# Patient Record
Sex: Female | Born: 1970 | Race: White | Hispanic: No | Marital: Married | Smoking: Current every day smoker
Health system: Southern US, Academic
[De-identification: ages and names within clinical notes are randomized; demographics above are authoritative.]

## PROBLEM LIST (undated history)

## (undated) DIAGNOSIS — G4730 Sleep apnea, unspecified: Secondary | ICD-10-CM

## (undated) DIAGNOSIS — I10 Essential (primary) hypertension: Secondary | ICD-10-CM

## (undated) DIAGNOSIS — E119 Type 2 diabetes mellitus without complications: Secondary | ICD-10-CM

## (undated) DIAGNOSIS — E782 Mixed hyperlipidemia: Secondary | ICD-10-CM

## (undated) HISTORY — DX: Type 2 diabetes mellitus without complications (CMS HCC): E11.9

## (undated) HISTORY — DX: Sleep apnea, unspecified: G47.30

## (undated) HISTORY — PX: HX ENDOMETRIAL BIOPSY: 2100001106

## (undated) HISTORY — DX: Mixed hyperlipidemia: E78.2

## (undated) HISTORY — PX: HX GALL BLADDER SURGERY/CHOLE: SHX55

## (undated) HISTORY — DX: Essential (primary) hypertension: I10

## (undated) HISTORY — PX: COLONOSCOPY: SHX174

## (undated) NOTE — Progress Notes (Signed)
Formatting of this note is different from the original.  Images from the original note were not included.    [facility] Department of Surgery- Breast Surgery  Breast Surgery Progress Note     Patient Name: Meredith Brown  Med Rec: [id number]  Date of Clinic Visit: 11/08/2022   Date of Birth: June 02, 1971     Subjective:   Meredith Brown is a nice 63 y.o. female presents to the clinic 3 weeks for post-operative follow-up s/p right excisional biopsy. Patient presents without significant complaints or symptoms suggestive of complication. Her pain is well managed.     The patient was seen via telehealth.     Review of Systems:  Patient denies fevers, chills, redness or drainage at surgery site. Symptoms were reviewed and updated as appropriate.   Objective:     Physical Exam:  Axilla incision seen by photo and healing well without complication.    Pathology: 10/18/2022  A.  SOFT TISSUE, RIGHT AXILLA "LIPOMA", EXCISION:  LIPOMA.    Assessment and Plan:   Telemed Discussion:    Patient seen via video visit to the home in lieu of clinic appointment per patient request.     The patient's imaging and pathology results were reviewed with specific attention to results since last appointment and impact on plan for treatment.  The patient appropriately asked several questions regarding the pathology report and treatment care post op.     Axilla incision seen by photo and healing well without complication.    Meredith Brown is s/p right excisional biopsy.  She is doing well postoperatively. Interval three weeks post op. Reviewed details from surgery and pathology report in detail. Pathology revealed benign finding so patient can follow up as needed. All patients questions have been answers.     Plan:  Follow up PRN, always happy to see her back if needed  Patient knows to call with any questions or concerns and I am happy to see her back sooner if needed    Larita Fife T. Dengel, MD, MSc  Assistant Professor  Department of Surgery  Melanoma & Breast  Surgery      Electronically signed by Shelby Dubin, MD at 11/10/2022  7:32 PM EDT

## (undated) NOTE — Progress Notes (Signed)
Formatting of this note is different from the original.  Images from the original note were not included.      [facility] Department of Surgery  Multidisciplinary Melanoma Initial Visit Consultation     Patient Name: Meredith Brown  Med Rec: [id number]  Date of Clinic Visit: 06/27/2022   Date of Birth: 05/21/1971     PCP:  Sherilyn Cooter    Referring Physician:  Bunnie Philips     CC: Newly diagnosed with lipoma located on  right axillary tail/axilla       History of Present Illness:     Meredith Brown is a nice 69 y.o. 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 Bunnie Philips for evaluation and treatment planning for a newly diagnosed lipoma. The patient is here today to discuss her new diagnosis and findings to date.    Patient reports she is had a mass in her lower arm pit for several years it did increase in size over time although has been stable recently.  It is intermittently tender and occasionally gets infected.  She reports she saw a surgeon previously who told her there would be a low risk of lymphedema with the excision and therefore she declined.  She comes in today strongly desiring excision of this mass.  The patient reports she had a mammogram in January of 2023 at [facility] in [facility] which was normal.    Patient denies fevers, chills, weight loss, and bone pain.     Pathology: None  Imaging: None      Social History     Socioeconomic History    Marital status: Married     Spouse name: Not on file    Number of children: Not on file    Years of education: Not on file    Highest education level: Not on file   Occupational History    Not on file   Tobacco Use    Smoking status: Not on file    Smokeless tobacco: Not on file   Substance and Sexual Activity    Alcohol use: Not on file    Drug use: Not on file    Sexual activity: Not on file   Other Topics Concern    Not on file   Social History Narrative    Not on file     Social Determinants of  Health     Financial Resource Strain: Not on file   Food Insecurity: Not on file   Transportation Needs: Not on file   Physical Activity: Not on file   Stress: Not on file   Social Connections: Not on file   Intimate Partner Violence: Not on file   Housing Stability: Not on file     I have reviewed the past social, surgical and family history and updated as appropriate.    Review of Systems:     ROS   Pertinent ROS and all other systems are negative,except as discussed here and in HPI.      Physical Exam:     There were no vitals taken for this visit.    Physical Exam       Pertinent physical exam findings:   General: Alert and interactive, NAD  CV:  RRR  PULM:  CTAB, no increased WOB  Bilateral breast without masses, dominant nodule, skin changes or nipple discharge or retraction.   Lymph nodes: No axillary or supraclavicular adenopathy bilaterally.  In the lower right axilla or upper aspect of the right axillary tail there is a 5 cm soft mobile mass.  I do not appreciate any overlying skin changes.  This does not appear to be adherent to underlying structures.        Assessment/Plan:     Meredith Brown is a 39 y.o. female who comes for a 2nd opinion (patient comes from [place]) regarding her right lower axillary/upper axillary tail mass which she would like to have excised.  Given the size of this mass this is recommended.  I discussed with her that it is possibly accessory breast tissue versus a lipoma.  I believe it is very unlikely to be a malignancy and she understands a bit were she would need additional treatment possibly additional surgery.  She would previously been told there would be a risk of lymphedema but this appears to be in the subcutaneous tissues and not involving the deep axillary space so I do not expect she would have lymphedema following surgery.  The patient was very enthusiastic to move forward with scheduling in the new year and we have scheduled this for February of 2024.     The plan  for surgery was discussed including expectations for the day of surgery, risks and benefits of the procedure and possible complications including but not limited to bleeding, infection, the need for a second operation based on pathology results.  Additionally, I explained the expected recovery course and timeline, expected symptoms and reasons to call including increased swelling, fever or redness or drainage at the incision. The patient and family appropriately had several questions regarding the surgical plan and overall treatment which I answered for them.      PLAN:  Excisional biopsy right axillary tail, axillary mass under sedation with local anesthetic at [facility] on [facility] Feb 22    My total time on this date and for this encounter was 45 minutes which included the following activities preparing to see the patient, obtaining and/or reviewing separately obtained history, performing a medically necessary exam and/or evaluation, counseling and educating the patient/family/caregiver, ordering medications, tests or procedures, referring and communicating with others, and documenting clinical information in the medical record. This time is independent, non-overlapping and does not include time for any services which are separately reported.     Lynne Logan. Dengel, MD, MSc  Assistant Professor  Department of Surgery  Melanoma & Breast Surgery    Electronically signed by Erven Colla, MD at 07/05/2022  1:50 PM EST

---

## 2004-01-10 ENCOUNTER — Other Ambulatory Visit (HOSPITAL_COMMUNITY): Payer: Self-pay | Admitting: OBSTETRICS/GYNECOLOGY

## 2010-09-04 ENCOUNTER — Ambulatory Visit (HOSPITAL_COMMUNITY): Payer: Self-pay | Admitting: INTERNAL MEDICINE

## 2012-09-26 IMAGING — MG MAMMO SCREEN W CAD
1 series · 5 of 5 positions shown · non-contrast
Comparison: 

Jumper, Lorenz

Exam:
Bilateral digital screening mammogram with CAD
INDICATION: Annual.

[Series 2: R CC · right · 5 of 5 slices shown]
[im 1/5]
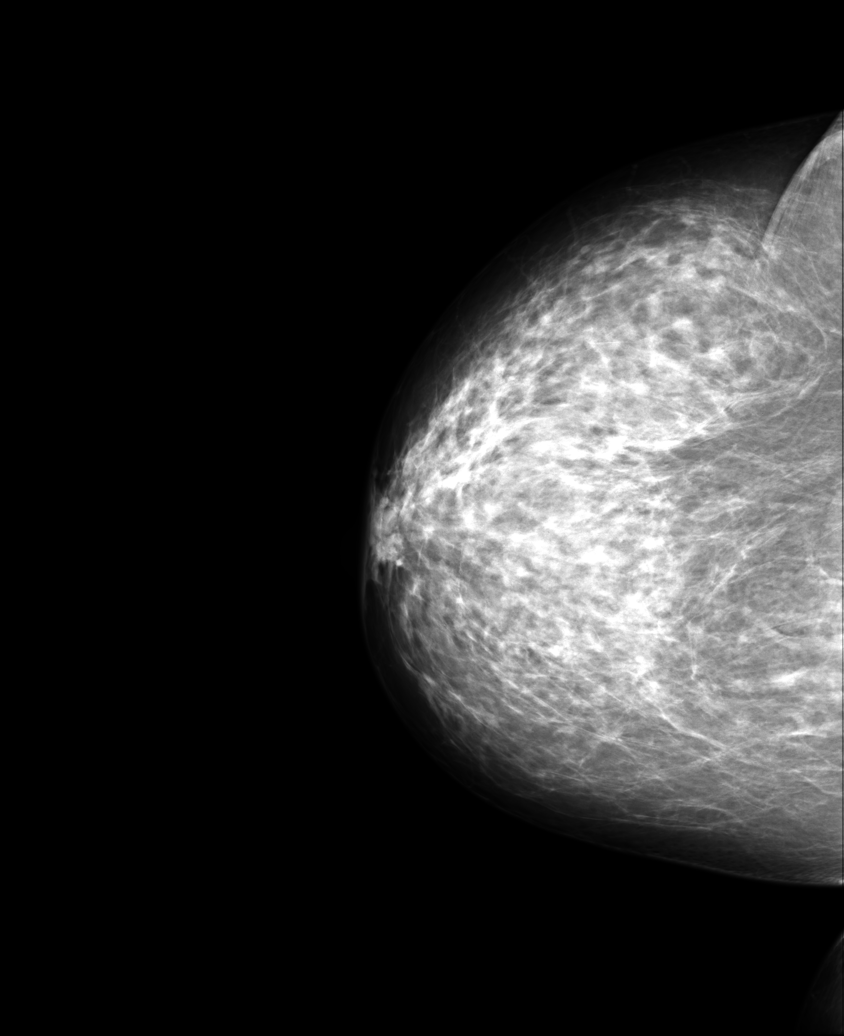
[im 2/5]
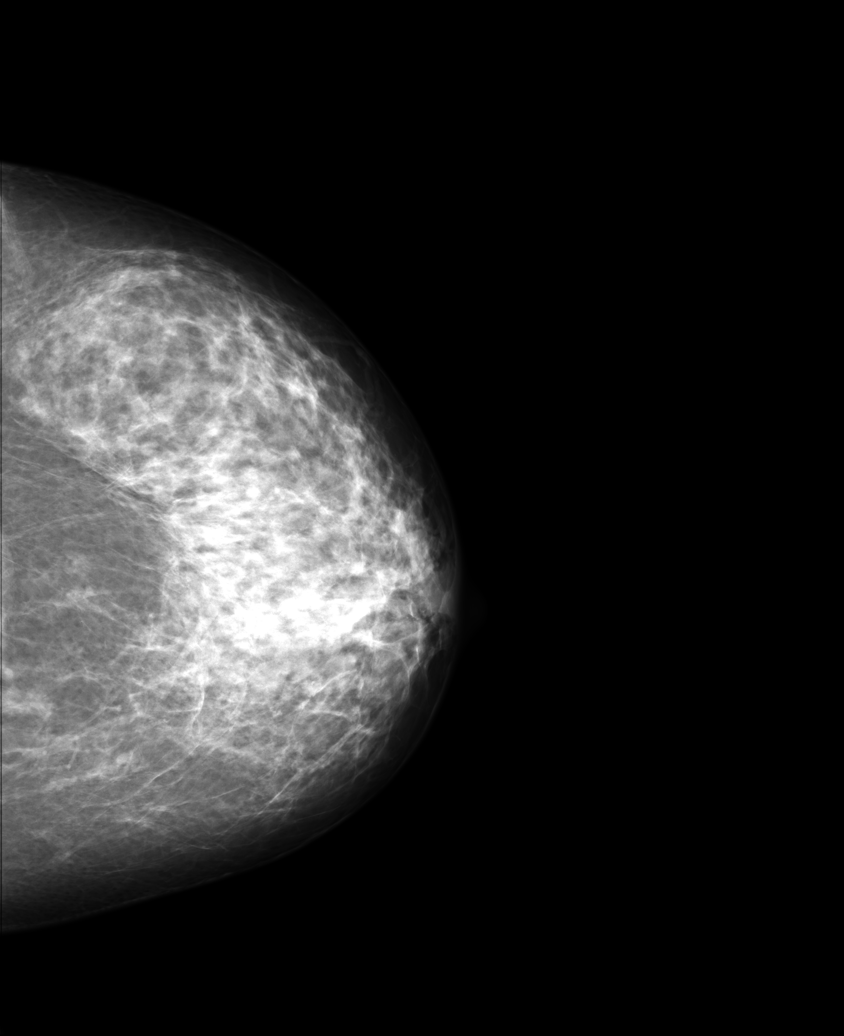
[im 3/5]
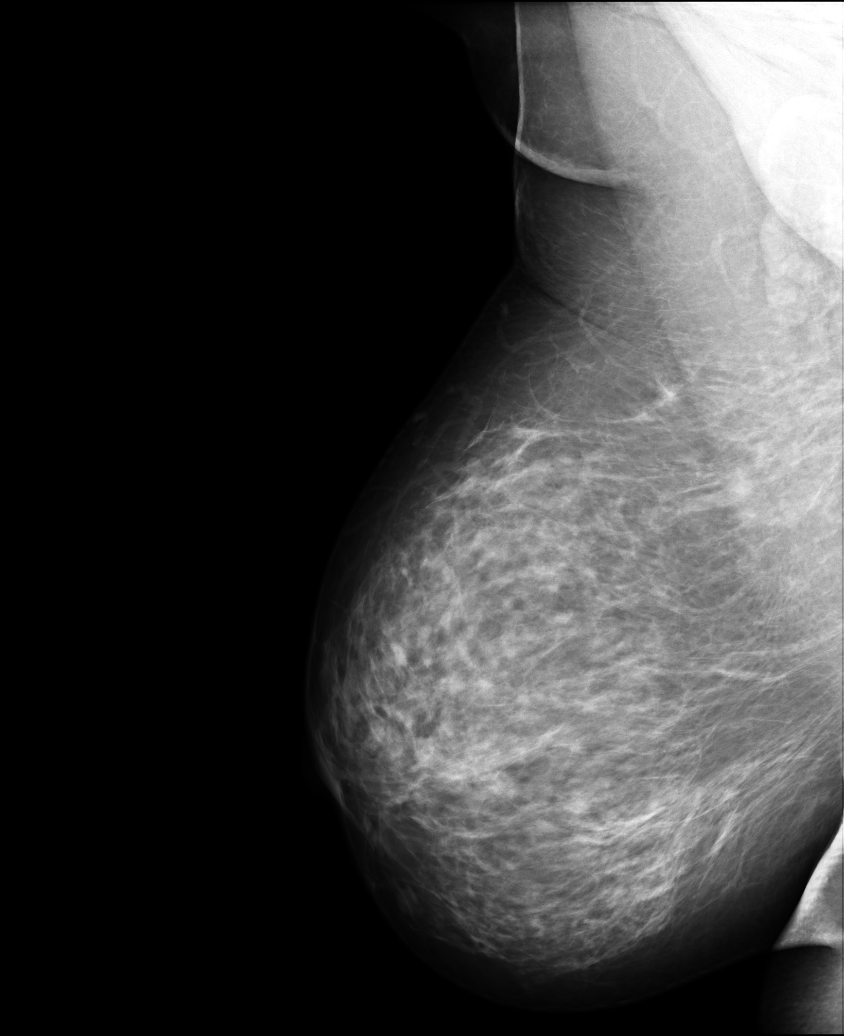
[im 4/5]
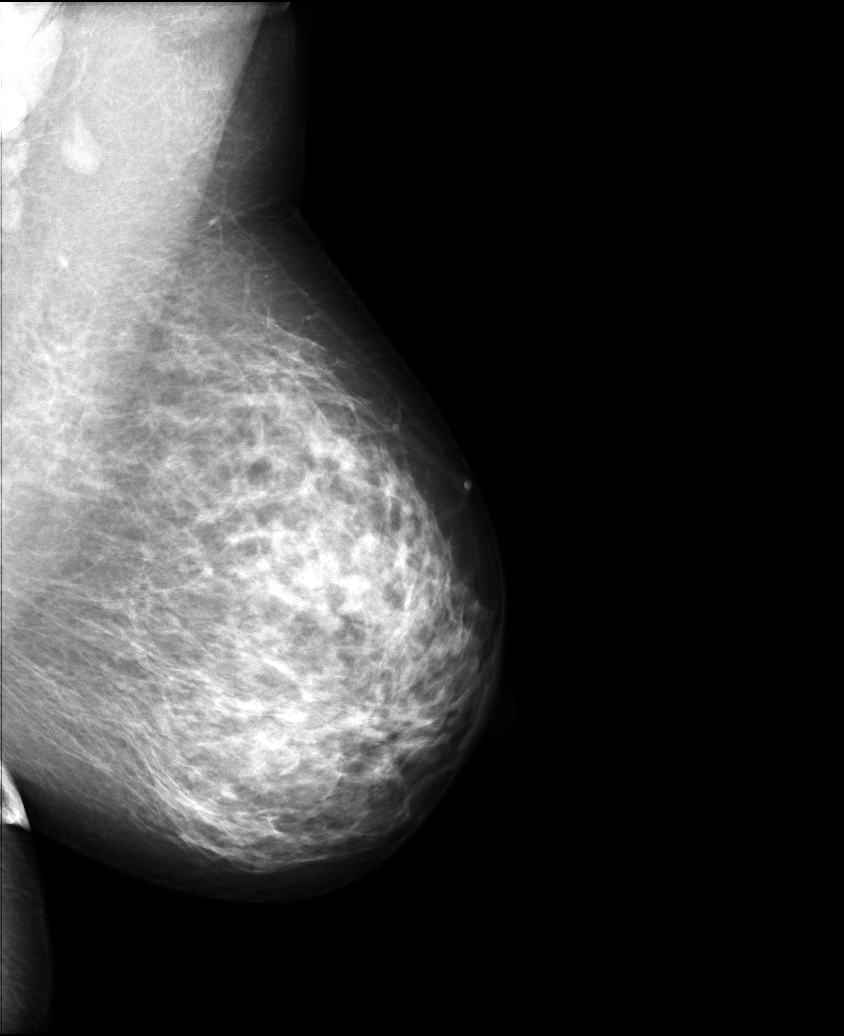
[im 5/5]
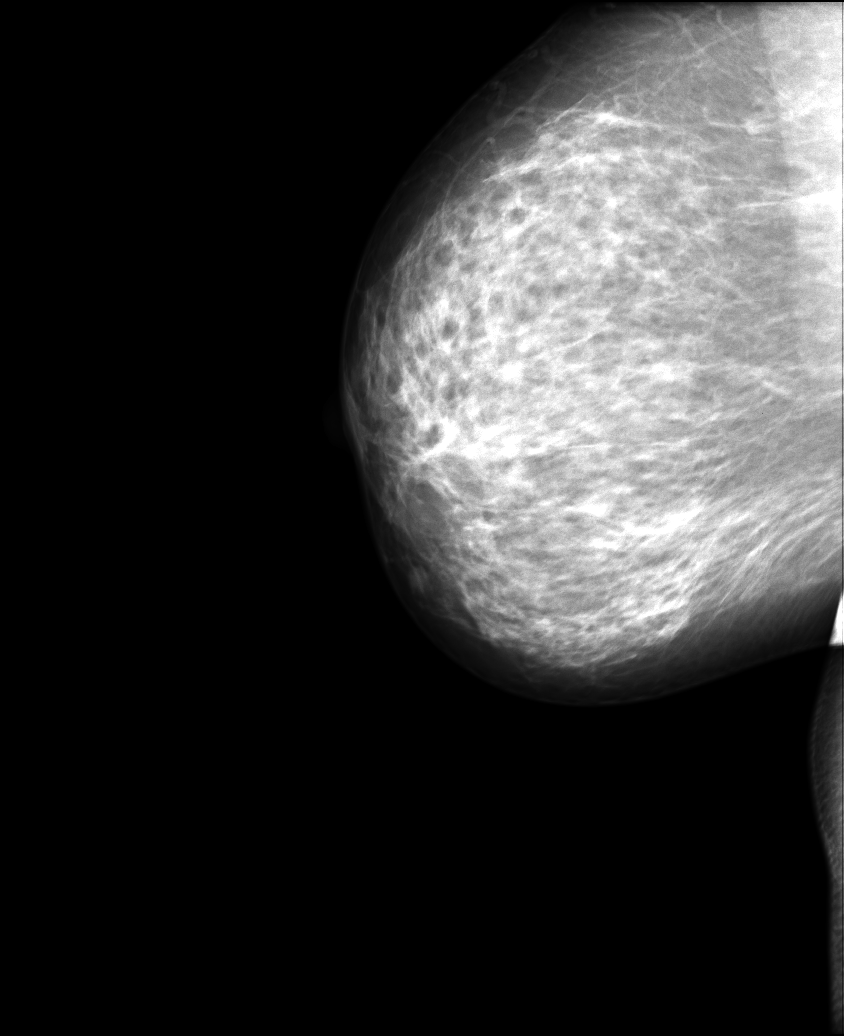

[5 of 5 positions shown; findings below may reference images not displayed]

FINDINGS: Breast parenchyma is heterogeneously dense, this decreases the sensitivity of mammogram. There is no mass or suspicious cluster of microcalcifications. There is no architectural distortion, skin thickening or nipple retraction.
IMPRESSION: Bi-Rads 2-Benign findings. 
RECOMMENDATIONS: Annual screening mammogram as per ACR guidelines is recommended. 
Final Assessment Code:
Bi-Rads 2 

BI-RADS 0
Need additional imaging evaluation
BI-RADS 1
Negative mammogram
BI-RADS 2
Benign finding
BI-RADS 3
Probably benign finding: short-interval follow-up suggested
BI-RADS 4
Suspicious abnormality:  biopsy should be considered
BI-RADS 5
Highly suggestive of malignancy; appropriate action should be taken

NOTE:
In compliance with Federal regulations, the results of this mammogram are being sent to the patient.

________________________________

## 2013-10-02 IMAGING — MG MAMMO DIGITAL SCREENING
1 series · 8 of 8 positions shown · non-contrast
Comparison: 

------------- REPORT [id number] -------------
PAPLO, MOLAY AHMED

MAMMO DIGITAL SCREENING WITH CAD
Exam:  
Bilateral digital screening mammogram with CAD
INDICATION: Annual screening.

[R CC · oblique · right · 8 of 8 slices shown]
[im 1/8]
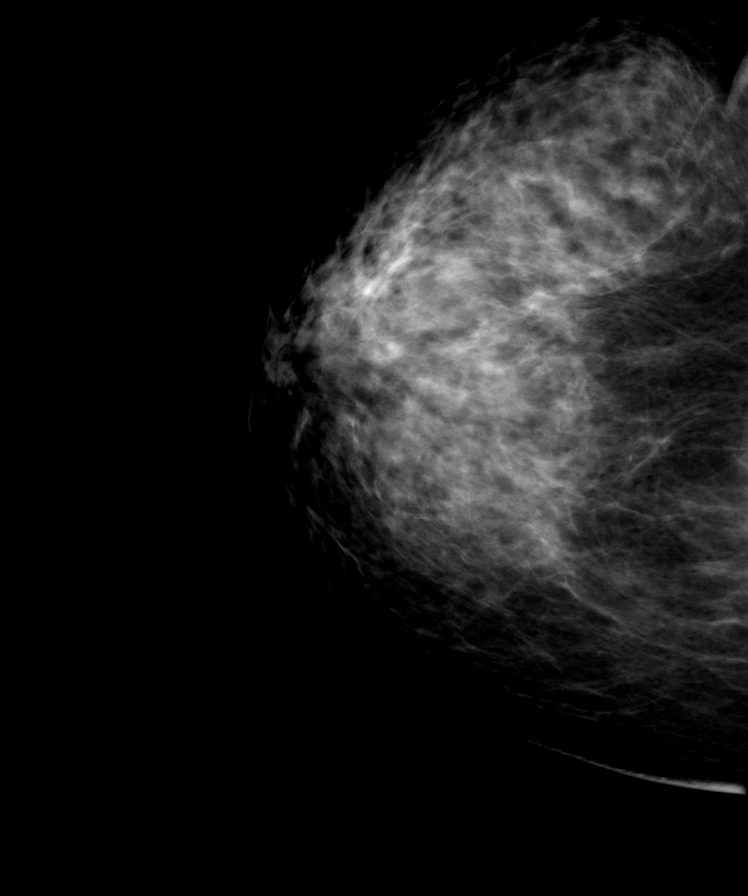
[im 2/8]
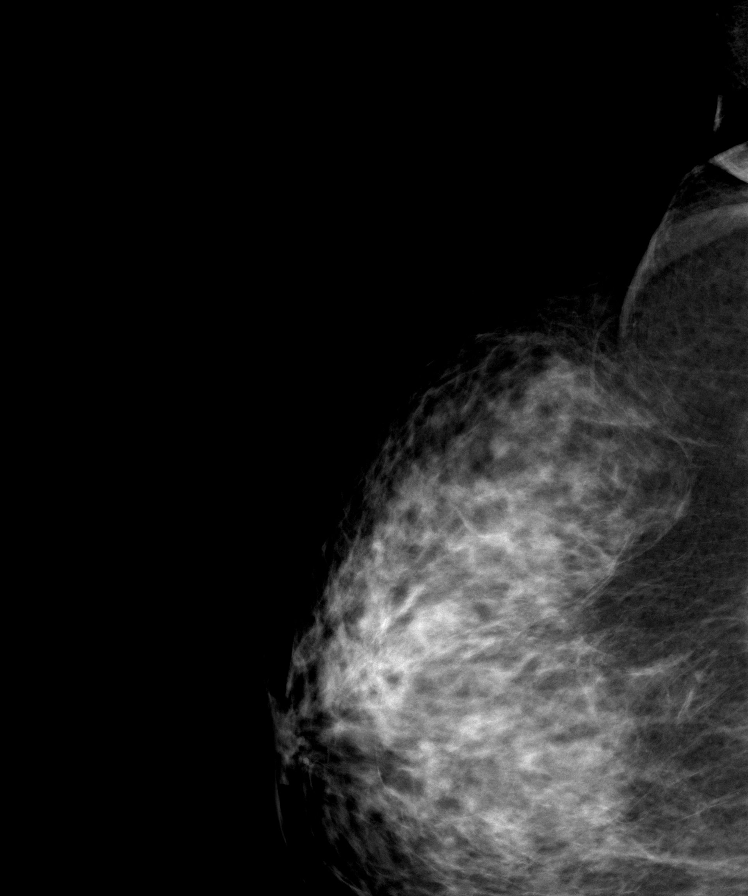
[im 3/8]
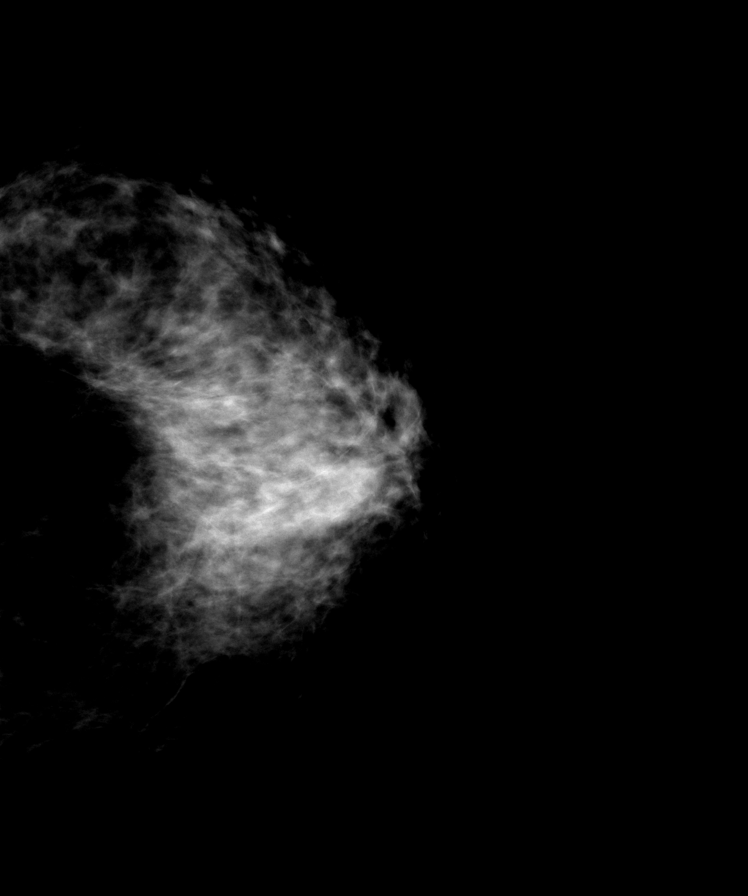
[im 4/8]
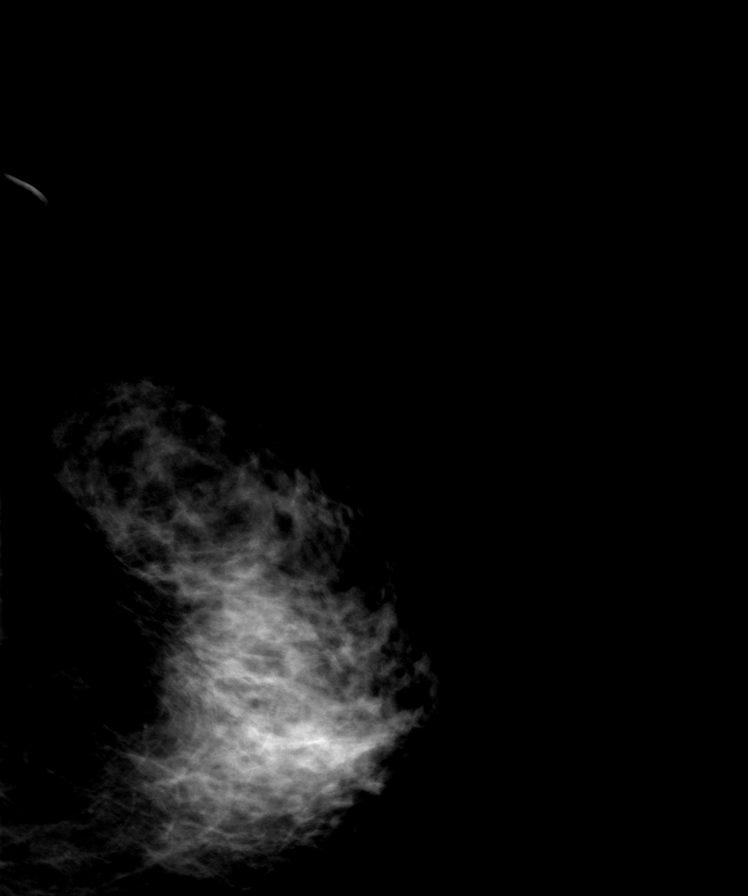
[im 5/8]
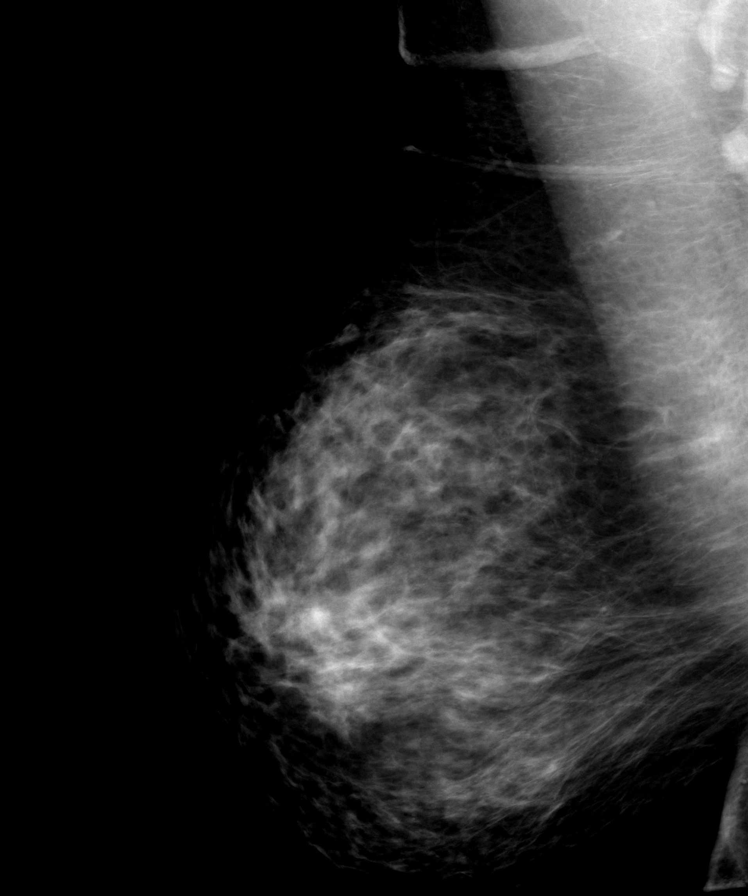
[im 6/8]
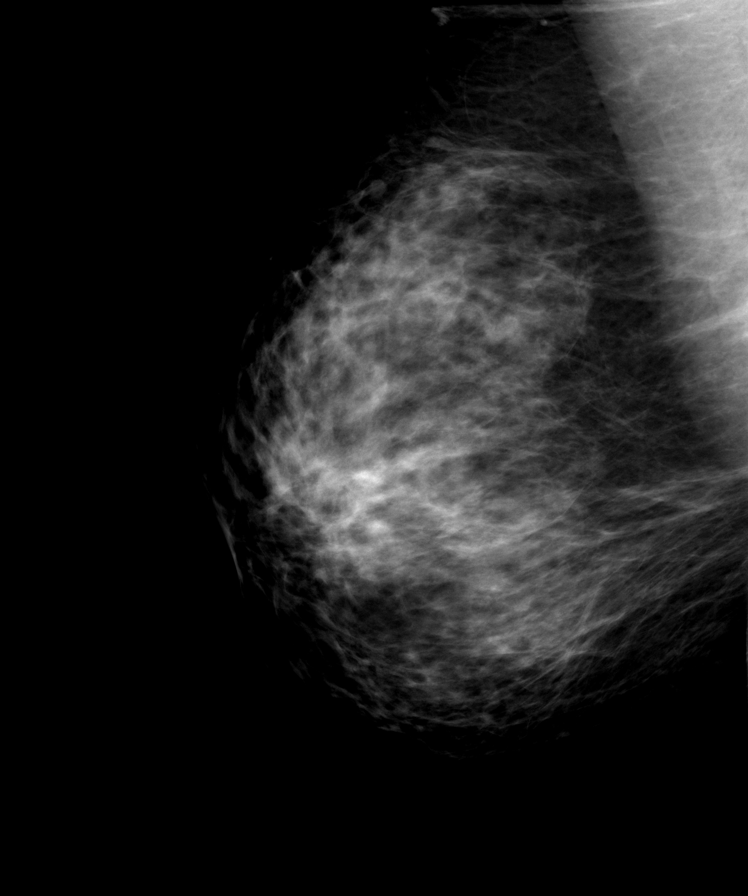
[im 7/8]
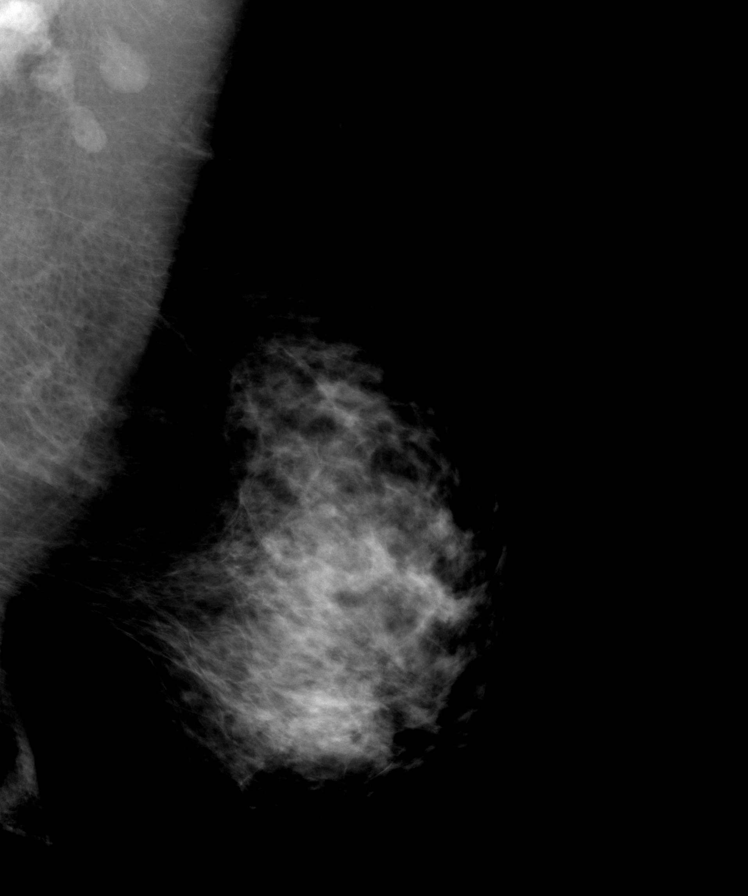
[im 8/8]
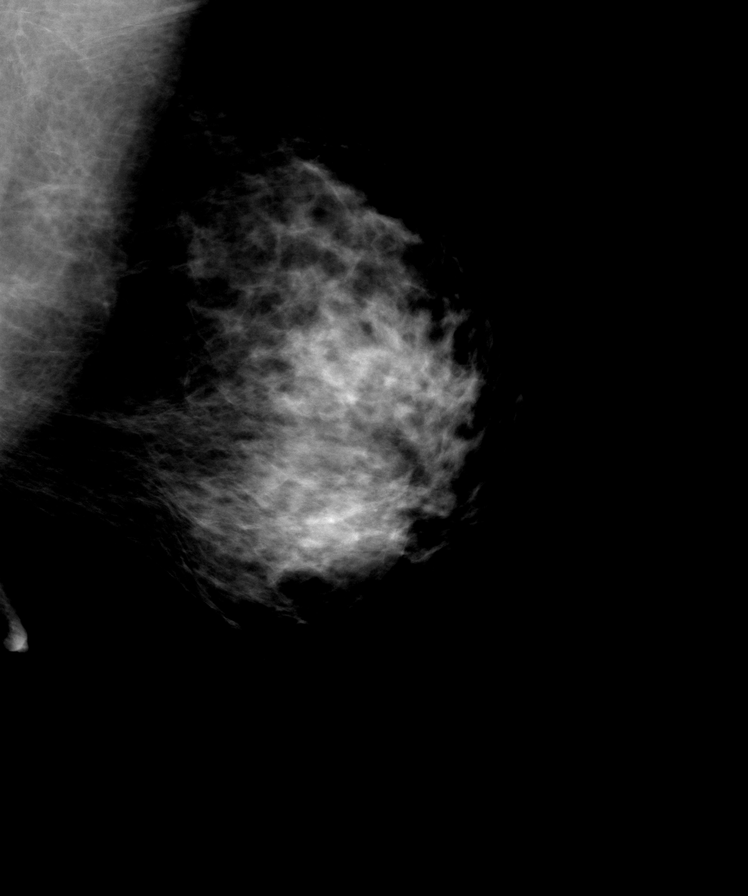

[8 of 8 positions shown; findings below may reference images not displayed]

FINDINGS: Breast parenchyma is heterogeneously dense; this decreases the sensitivity of mammogram. There is no mass or suspicious cluster of microcalcifications. There is no architectural distortion, skin thickening or nipple retraction.
IMPRESSION: 1. Bi-Rads 2-Benign findings. 
RECOMMENDATIONS: Annual screening mammogram as per ACR guidelines is recommended. 
Final Assessment Code:
Bi-Rads 2 

BI-RADS 0
Need additional imaging evaluation
BI-RADS 1
Negative mammogram
BI-RADS 2
Benign finding
BI-RADS 3
Probably benign finding: short-interval follow-up suggested
BI-RADS 4
Suspicious abnormality:  biopsy should be considered
BI-RADS 5
Highly suggestive of malignancy; appropriate action should be taken
BI-RADS 6
Known Biopsy-proven Malignancy  Appropriate action should be taken
NOTE:
In compliance with Federal regulations, the results of this mammogram are being sent to the patient.

------------- REPORT [id number] -------------
[facility]
[address]
Mor Ms.MAXIMUS, INANCH:
We wish to report the following on your recent mammography examination. We are sending a report to your referring physician or other health care provider. 
(       Normal/Negative:
No evidence of cancer.
This statement is mandated by the [place], Department of Health.
Your examination was performed by one of our technologists, who are registered radiological technologists and also specially certified in mammography:
___
Donjuan, Wavy (M)
___
Tena, Danielf (M)
___
Gia, Nelith (M)

Your mammogram was interpreted by our radiologist.

( 
Mmamontsho Seakolo, M.D.

(Annual Breast Examination by a physician or other health care provider
(Annual Mammography Screening beginning at age 40
(Monthly Breast Self Examination

## 2014-11-12 IMAGING — MG MAMMO DIGITAL SCREENING
1 series · 6 of 6 positions shown · non-contrast
Comparison: 

------------- REPORT [id number] -------------
BARRANTES, DANNY

MAMMO DIGITAL SCREENING WITH CAD
Exam:  
Screening digital mammogram with CAD
INDICATION: Annual screening.

[R CC · oblique · right · 6 of 6 slices shown]
[im 1/6]
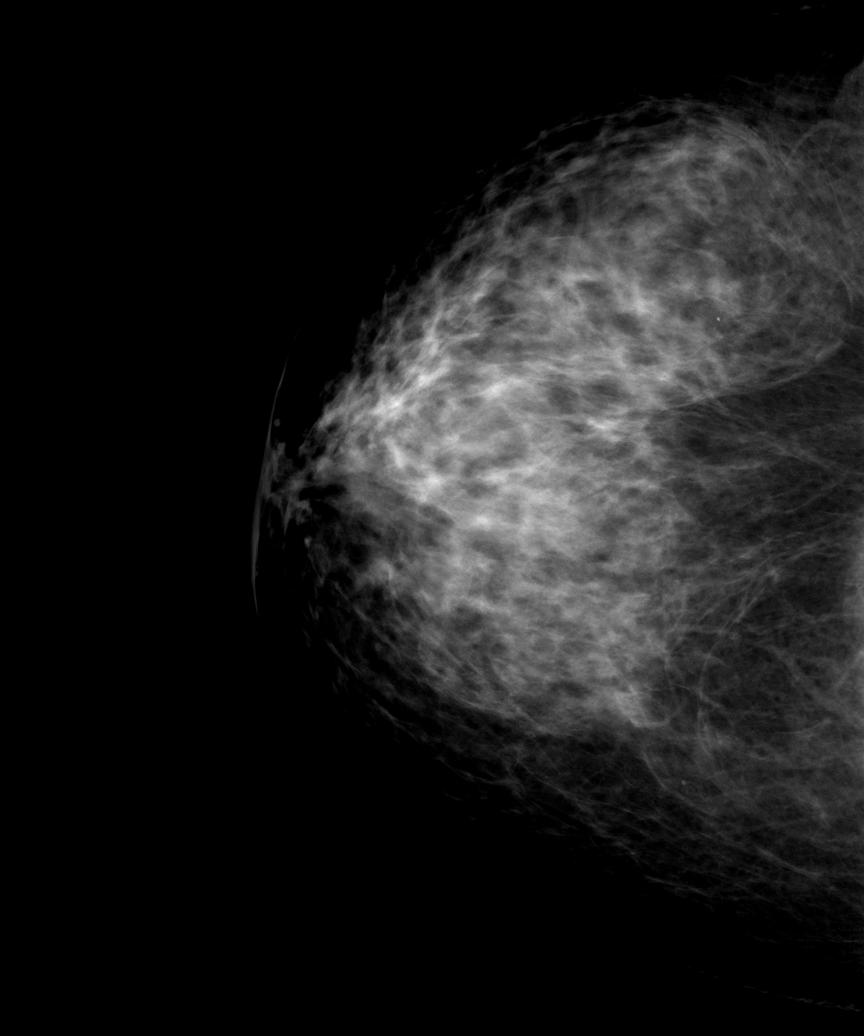
[im 2/6]
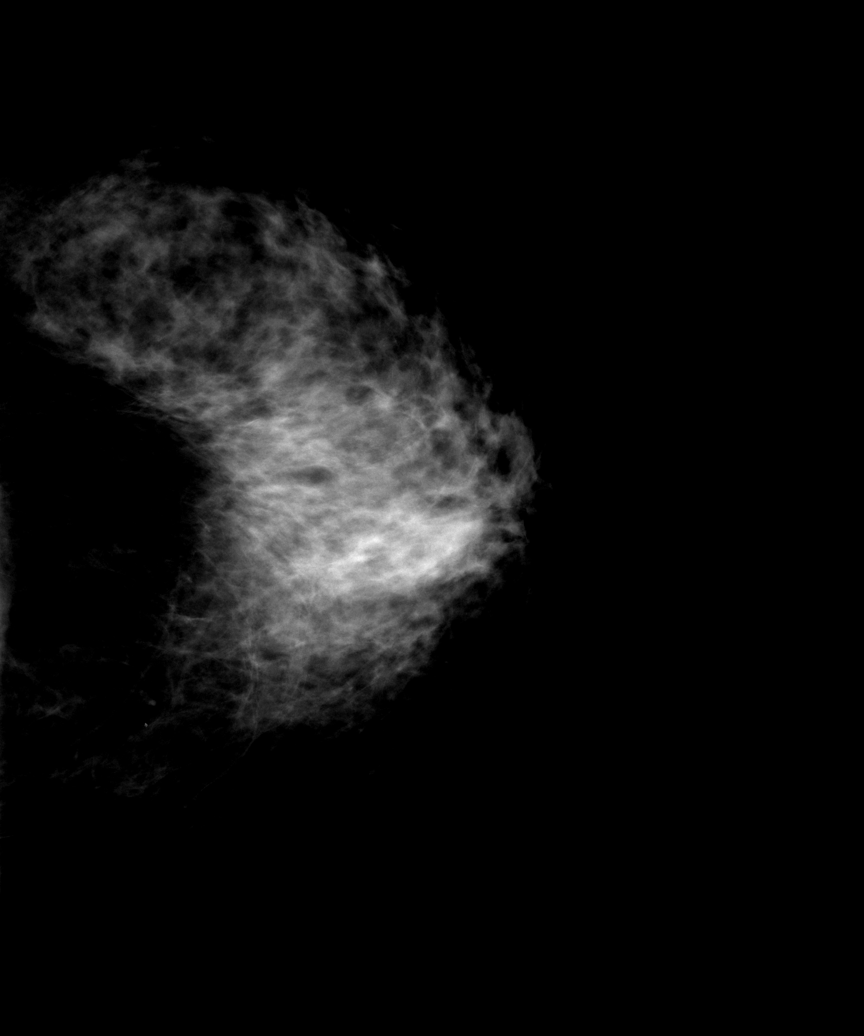
[im 3/6]
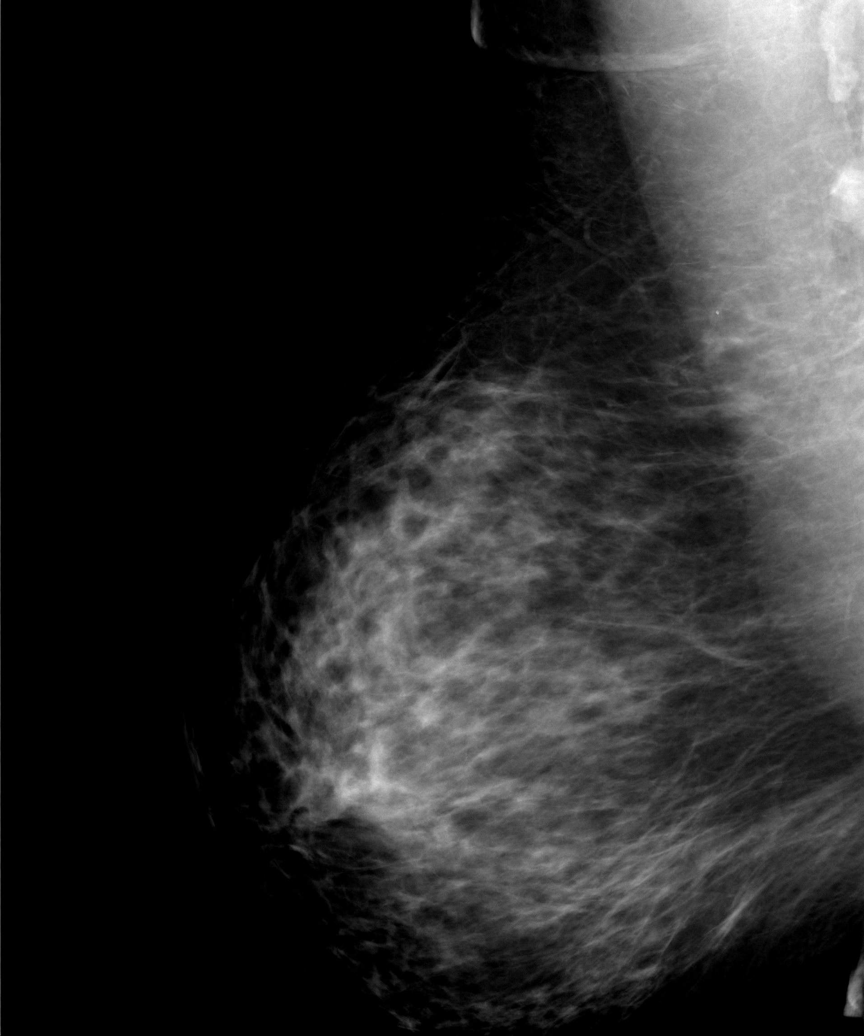
[im 4/6]
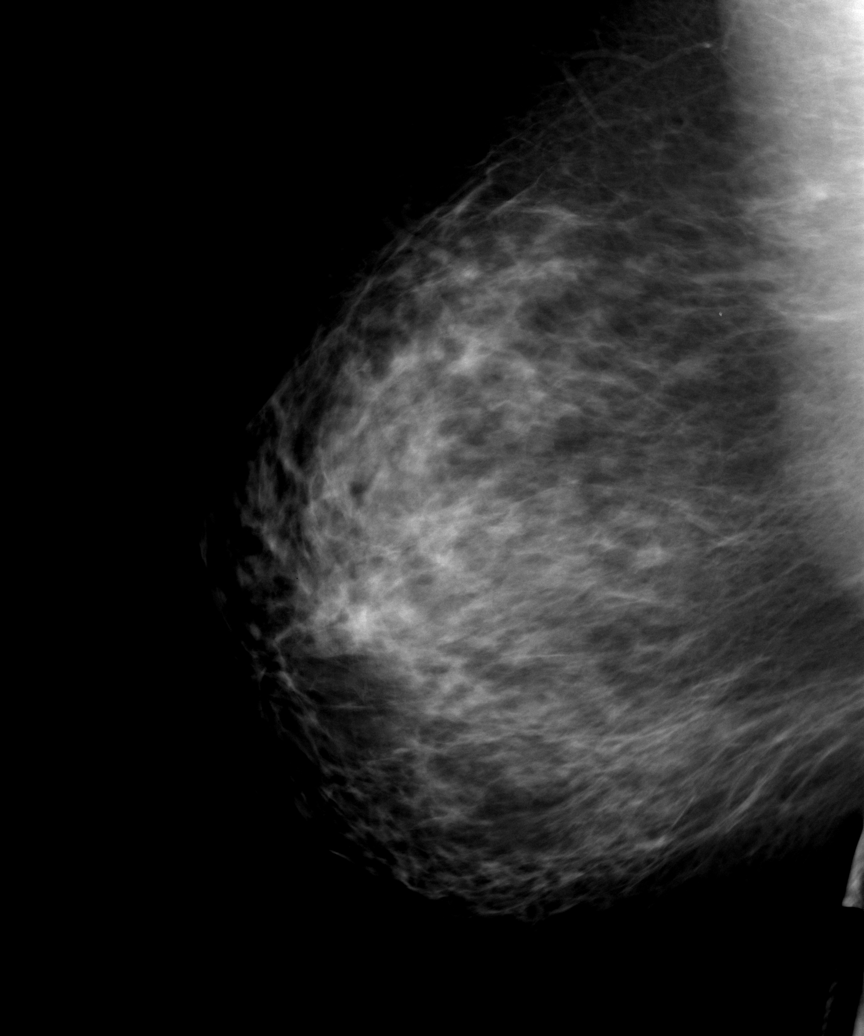
[im 5/6]
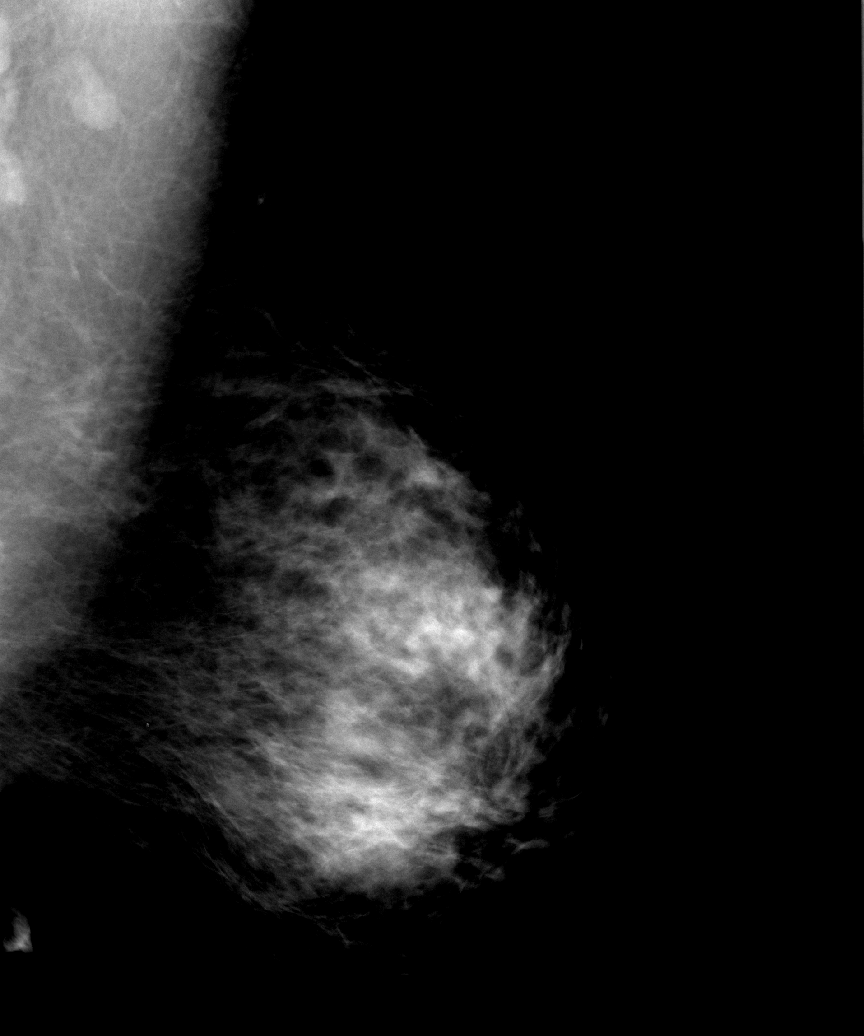
[im 6/6]
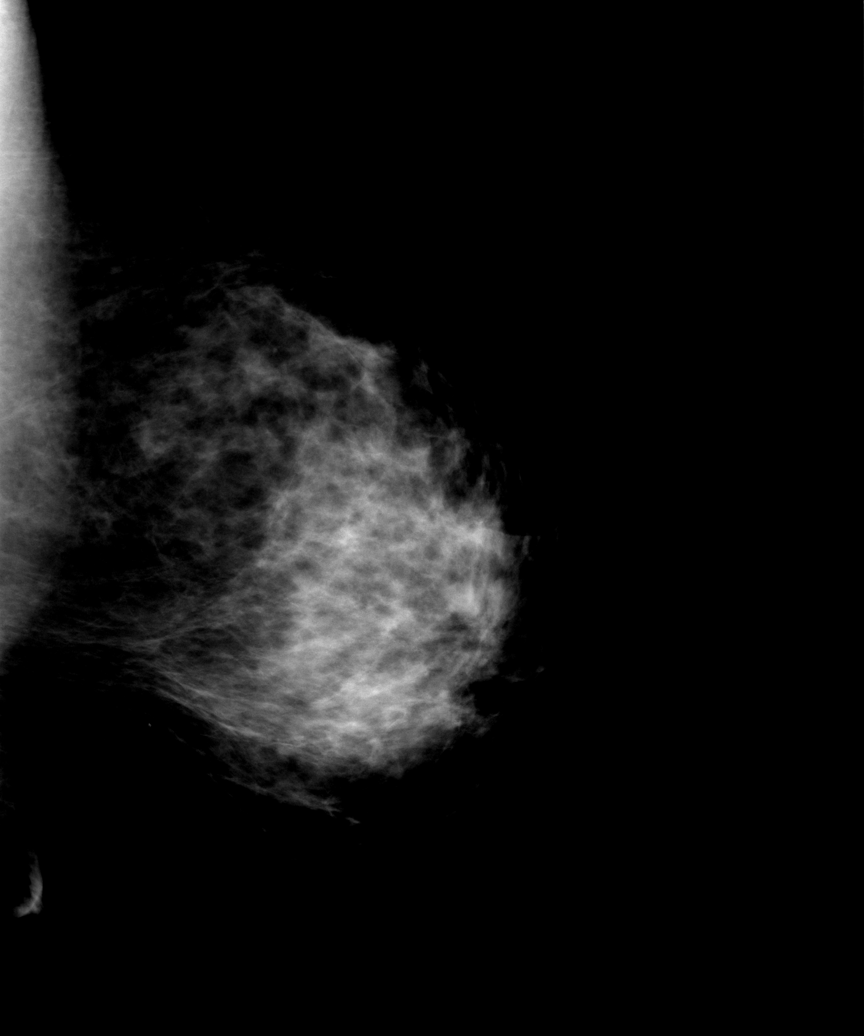

[6 of 6 positions shown; findings below may reference images not displayed]

FINDINGS: Breast parenchyma is heterogeneously dense. There is no mass or suspicious cluster of microcalcifications. There is no architectural distortion, skin thickening or nipple retraction.
IMPRESSION: 1.
BIRADS 2-Benign findings. Patient has been added in a reminder system with a
target date for the next screening mammography.
2.
DENSITY CODE   C (Heterogeneously dense)
Final Assessment Code:
Bi-Rads 2 

BI-RADS 0
Need additional imaging evaluation
BI-RADS 1
Negative mammogram
BI-RADS 2
Benign finding
BI-RADS 3
Probably benign finding: short-interval follow-up suggested
BI-RADS 4
Suspicious abnormality:  biopsy should be considered
BI-RADS 5
Highly suggestive of malignancy; appropriate action should be taken
BI-RADS 6
Known Biopsy-proven Malignancy  Appropriate action should be taken
NOTE:
In compliance with Federal regulations, the results of this mammogram are being sent to the patient.

------------- REPORT [id number] -------------
[facility]
[address]
Mor Ms.MAXIMUS, INANCH:
We wish to report the following on your recent mammography examination. We are sending a report to your referring physician or other health care provider. 
(       Normal/Negative:
No evidence of cancer.
This statement is mandated by the [place], Department of Health.
Your examination was performed by one of our technologists, who are registered radiological technologists and also specially certified in mammography:
___
Donjuan, Wavy (M)
___
Tena, Danielf (M)
___
Gia, Nelith (M)

Your mammogram was interpreted by our radiologist.

( 
Mmamontsho Seakolo, M.D.

(Annual Breast Examination by a physician or other health care provider
(Annual Mammography Screening beginning at age 40
(Monthly Breast Self Examination

## 2016-01-20 IMAGING — MG 3D SCREENING DIGITAL TOMOSYNTHESIS BIL
3 series · 4 of 21 positions shown · non-contrast
Comparison: Exams dated 02/18/17 and 01/09/16.

------------- REPORT [id number] -------------
LLANAS, MARTEZ

Exam:  
Bilateral 3D digital screening mammogram with CAD
HISTORY: Asymptomatic female with family history of breast cancer in her grandmother.

[Series 4005: 3D SCREENING DIGITAL TOMOSYNTHESIS BIL tomo · 2 acquisitions, 2 frames shown (1 of 2)]
[im 1/2]
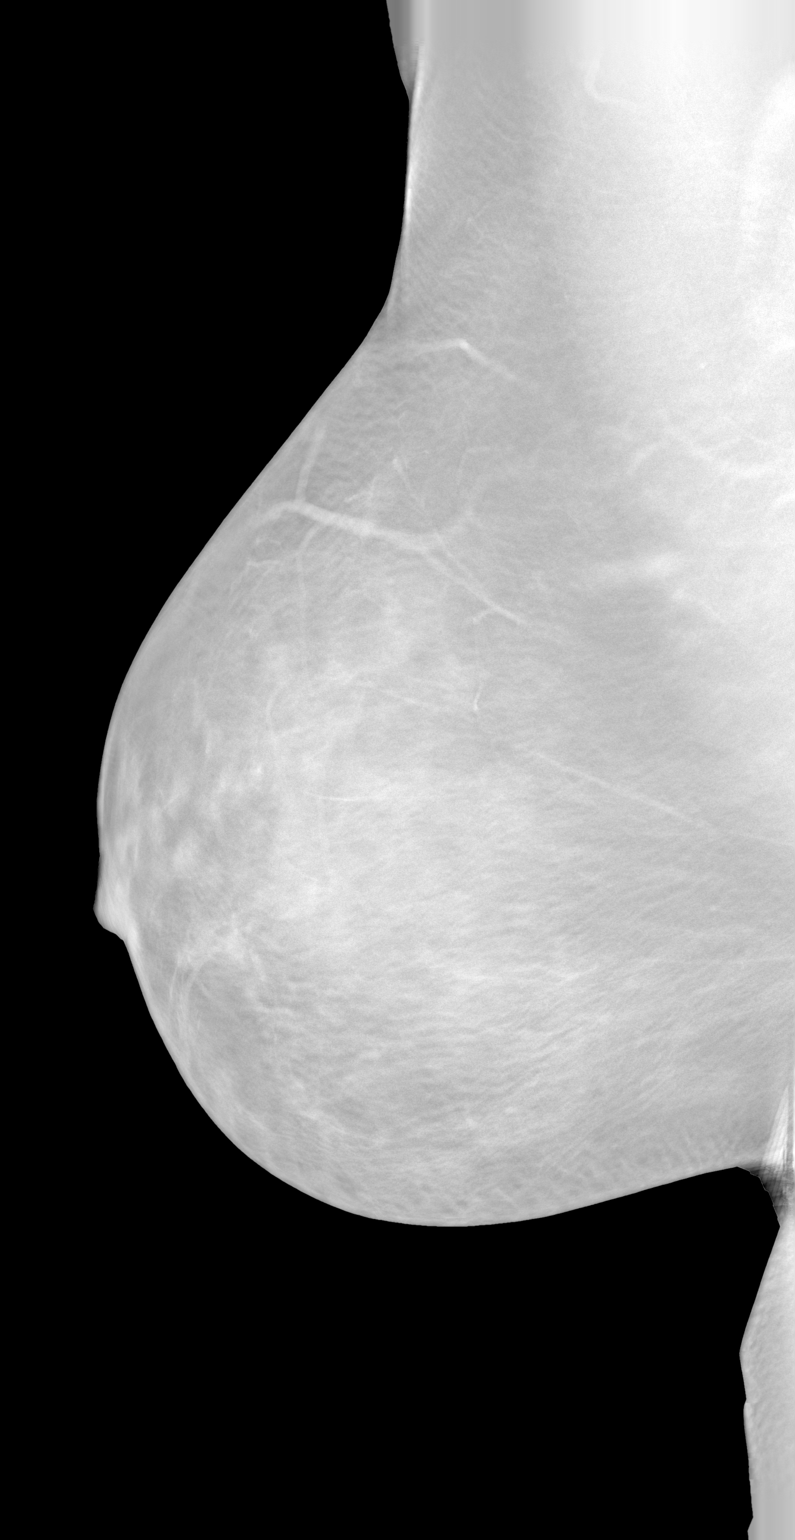
[im 2/2]
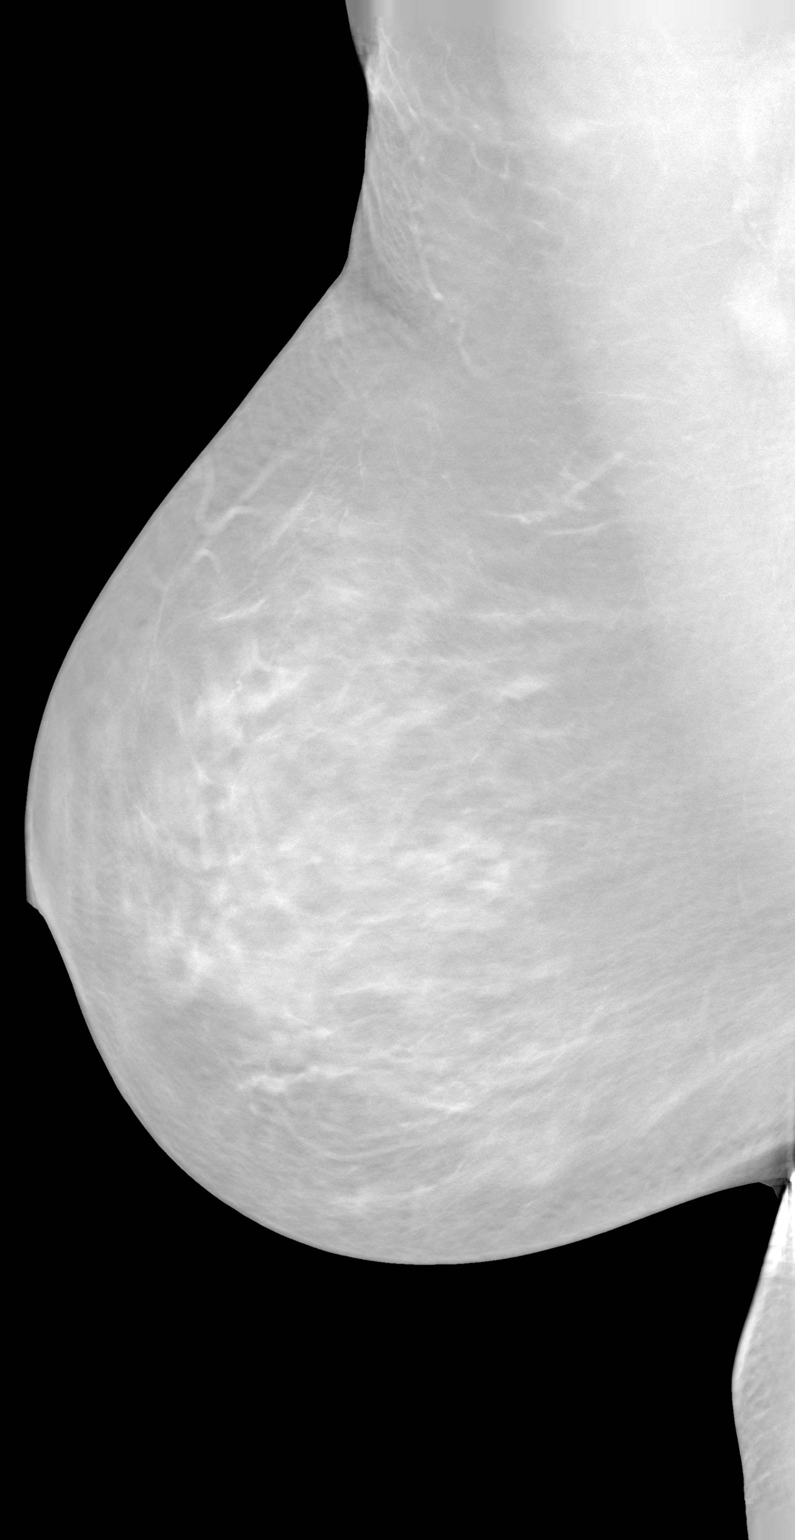

[R]
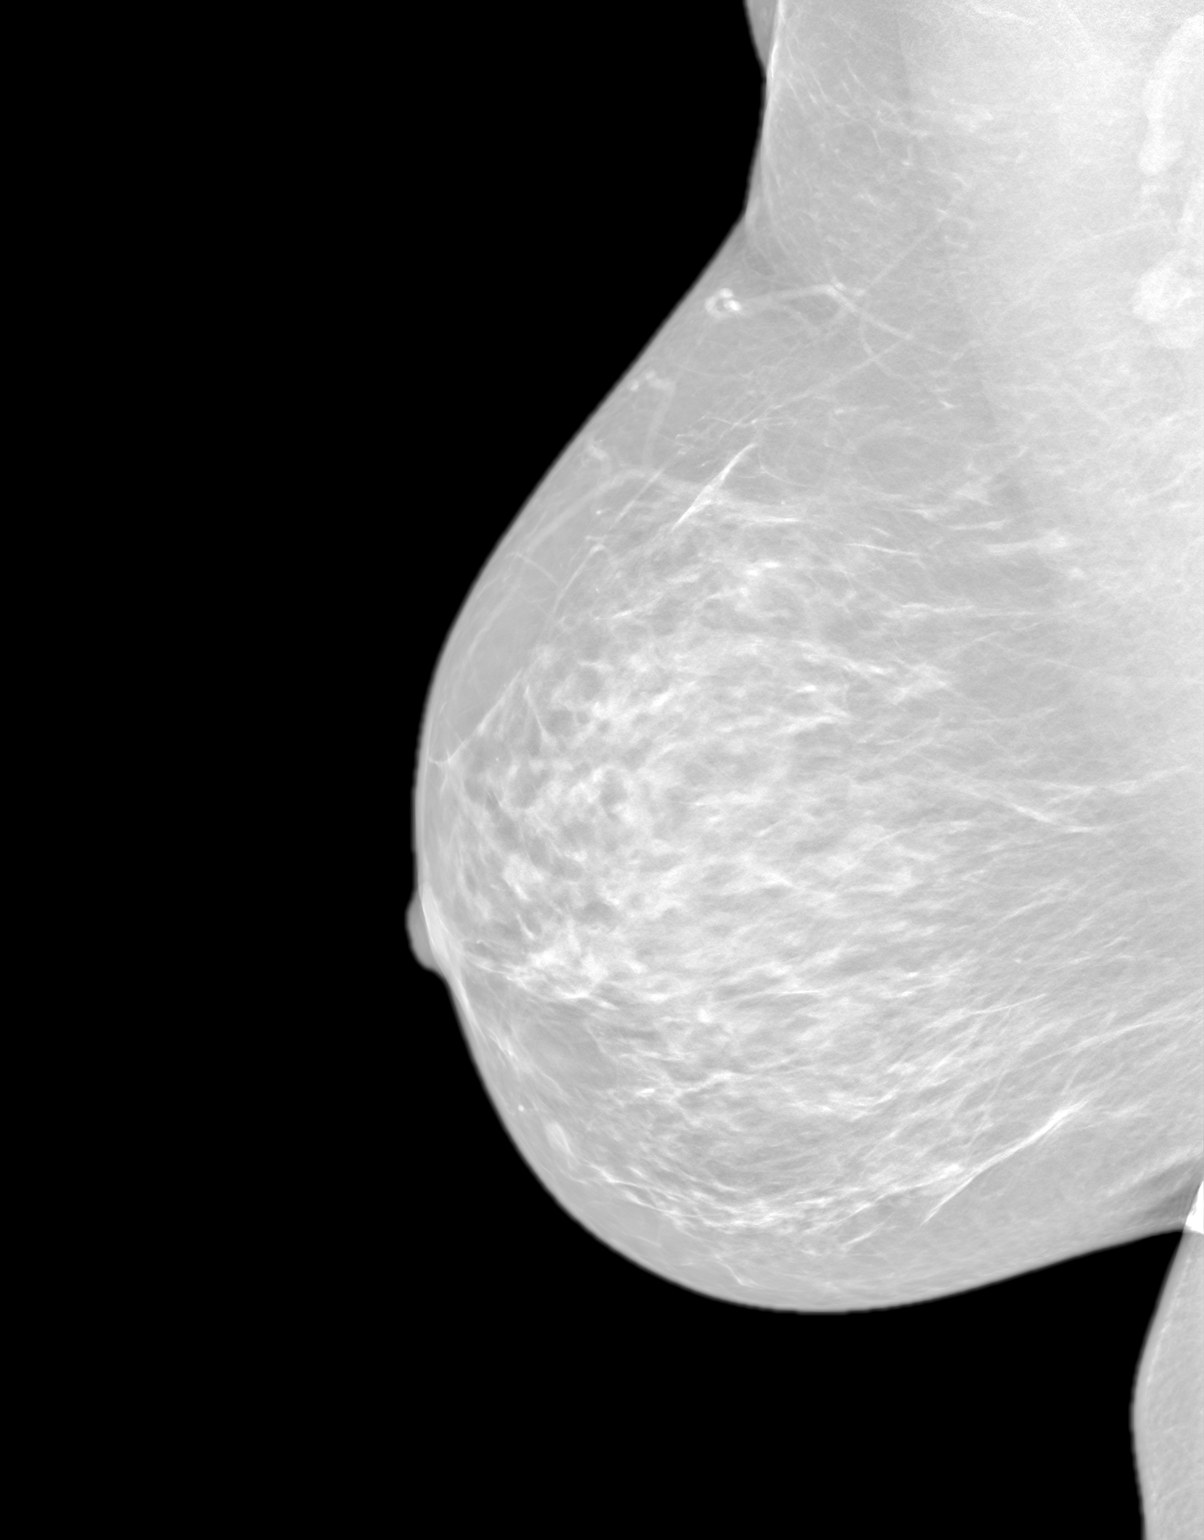

[3D SCREENING DIGITAL TOMOSYNTHESIS BIL tomo (2 of 2) · tomo slice 14/89.0]
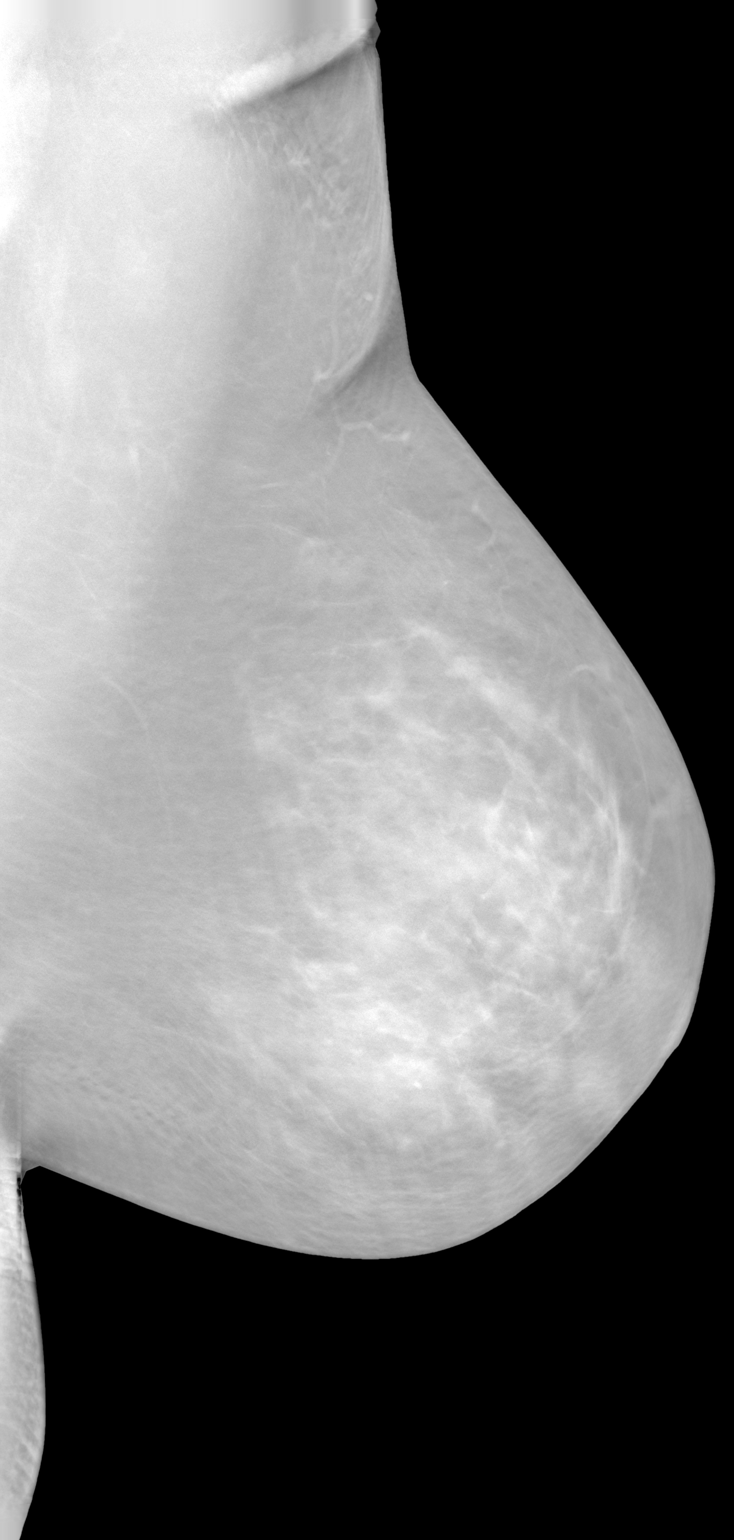

[4 of 21 positions shown; findings below may reference images not displayed]

FINDINGS: No focal mass or architectural change are noted. No abnormal calcific densities, skin changes, nipple changes or duct dilation are seen. Benign appearing lymph nodes of the axilla with fatty hilum are stable.
IMPRESSION: Stable mammographic findings. 

Clinical and mammographic followup at 12 months. 

Final Assessment Code:

Bi-Rads 2, density category B

BI-RADS 0
Need additional imaging evaluation

BI-RADS 1
Negative mammogram

BI-RADS 2
Benign finding

BI-RADS 3
Probably benign finding: short-interval follow-up suggested

BI-RADS 4
Suspicious abnormality:  biopsy should be considered

BI-RADS 5
Highly suggestive of malignancy; appropriate action should be taken

BI-RADS 6
Known Biopsy-proven Malignancy – Appropriate action should be taken

NOTE:
In compliance with Federal regulations, the results of this mammogram are being sent to the patient.

------------- REPORT [id number] -------------
[facility]
[address]
Daina Ms.ROBIKA, EPERJESI:
We wish to report the following on your recent mammography examination. We are sending a report to your referring physician or other health care provider. 
(       Normal/Negative:
No evidence of cancer.
This statement is mandated by the [place], Department of Health.
Your examination was performed by one of our technologists, who are registered radiological technologists and also specially certified in mammography:
___
Parlak, Edaly (M)
___
Dang, Mcalex (M)

Your mammogram was interpreted by our radiologist.

( 
Sofeine Made, M.D.

(Annual Breast Examination by a physician or other health care provider
(Annual Mammography Screening beginning at age 40
(Monthly Breast Self Examination

## 2019-03-27 IMAGING — MR MRI LUMBAR SPINE WITHOUT CONTRAST
5 of 6 series · 40 of 48 positions shown · IV contrast (gadolinium)
Comparison: [None available].
COMPARISON: None available.

------------- REPORT [id number] -------------
EXAM:  MRI LUMBAR SPINE WITHOUT CONTRAST
INDICATION: Lower back pain.
TECHNIQUE: Multiplanar multisequential MRI of the lumbar spine was performed without gadolinium contrast.

[Series 8: T2 · sagittal · 4.0mm · 0.94mm/px · 6 of 13 slices shown (1 of 3)]
[im 1/13]
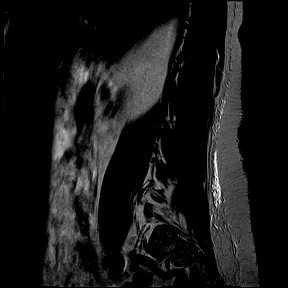
[im 3/13]
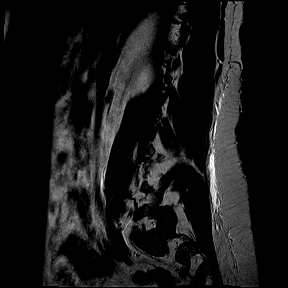
[im 5/13]
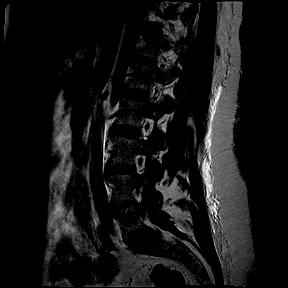
[im 8/13]
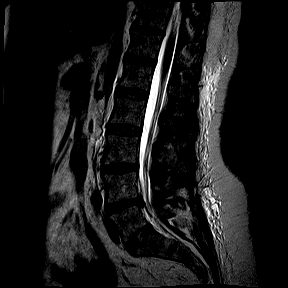
[im 10/13]
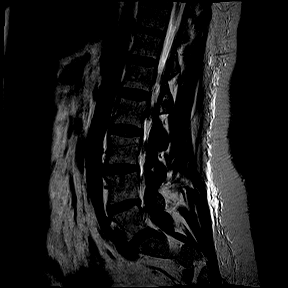
[im 13/13]
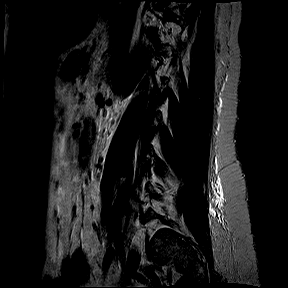

[Series 11: T1 · sagittal · 4.0mm · 0.94mm/px · 6 of 13 slices shown (1 of 2)]
[im 1/13]
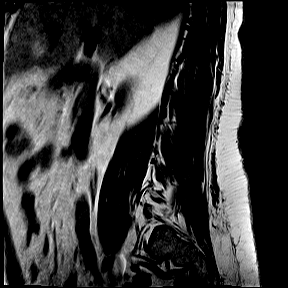
[im 3/13]
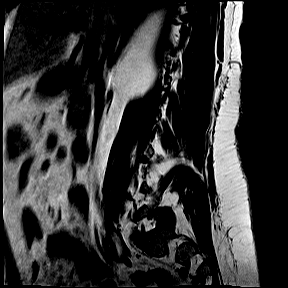
[im 5/13]
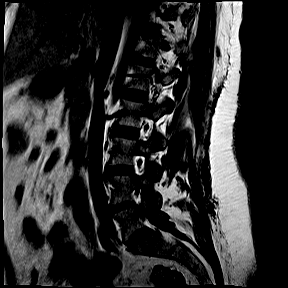
[im 8/13]
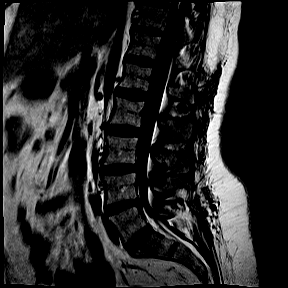
[im 10/13]
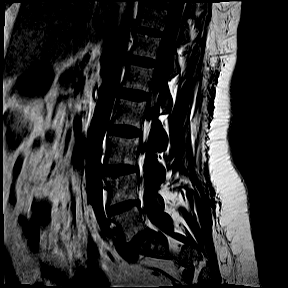
[im 13/13]
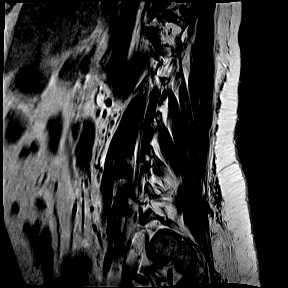

[Series 12: T2 · coronal · 4.0mm · 0.90mm/px · 9 of 20 slices shown (2 of 3)]
[im 1/20]
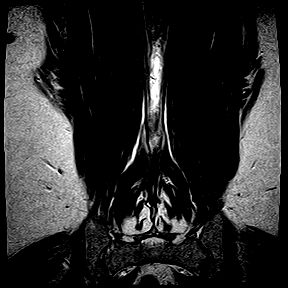
[im 3/20]
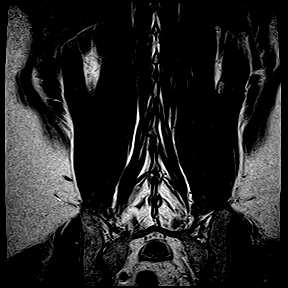
[im 5/20]
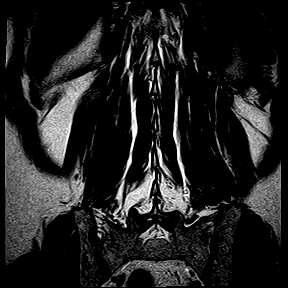
[im 8/20]
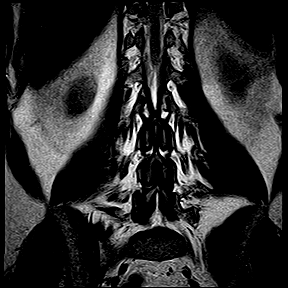
[im 10/20]
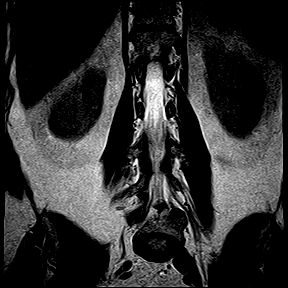
[im 12/20]
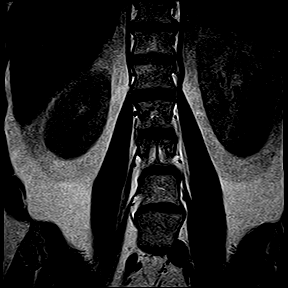
[im 15/20]
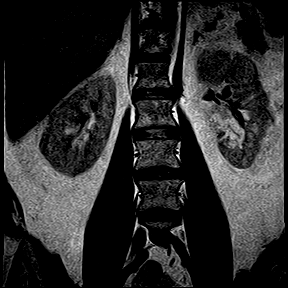
[im 17/20]
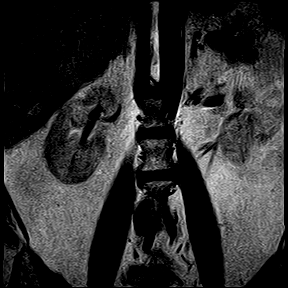
[im 20/20]
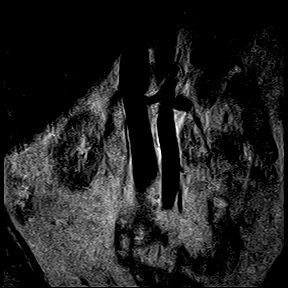

[Series 13: T2 · axial · 4.0mm · 0.47mm/px · z∈[-147,+55]mm · 11 of 23 slices shown (3 of 3)]
[im 1/23]
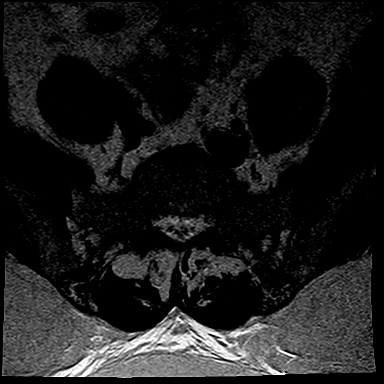
[im 3/23]
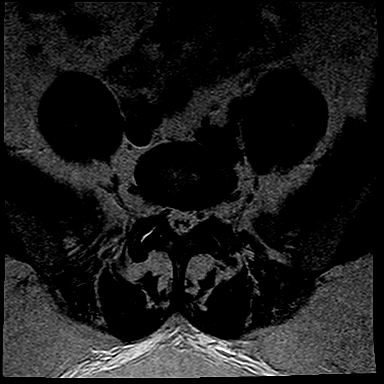
[im 5/23]
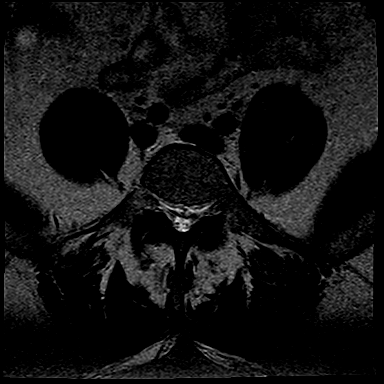
[im 7/23]
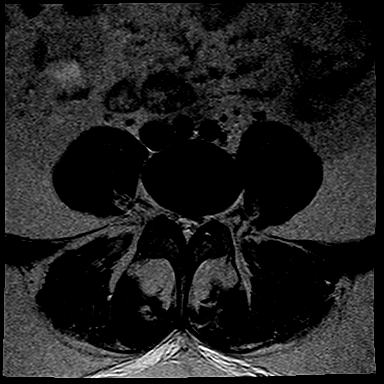
[im 9/23]
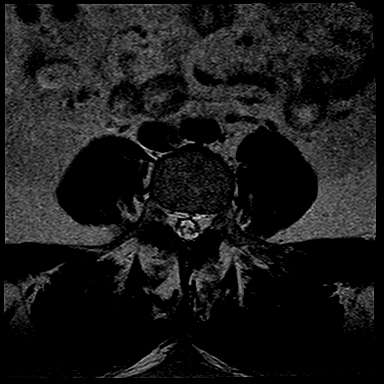
[im 12/23]
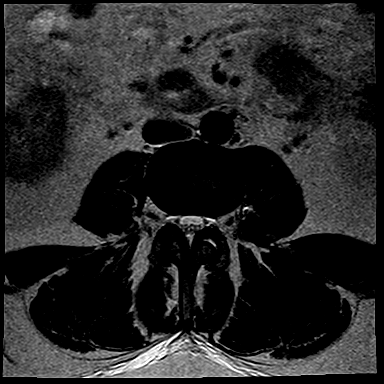
[im 14/23]
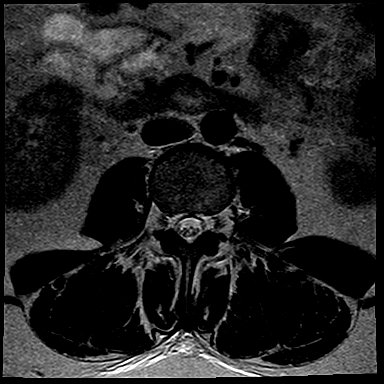
[im 16/23]
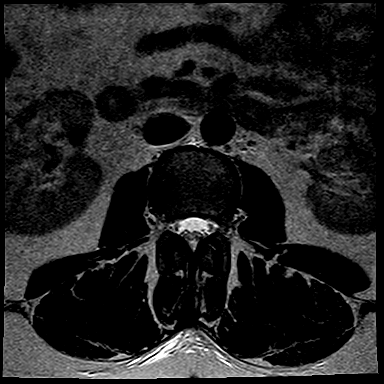
[im 18/23]
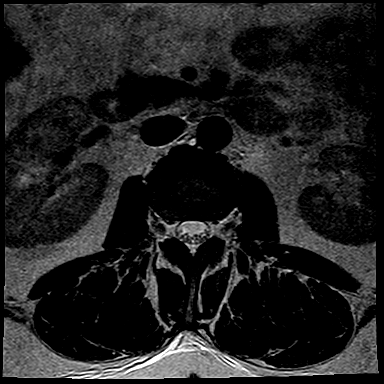
[im 20/23]
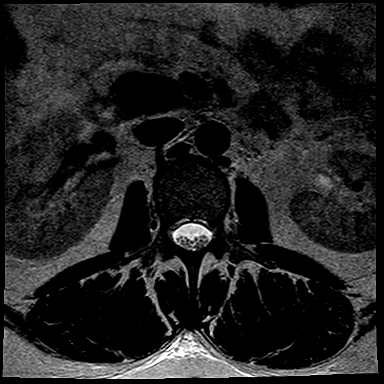
[im 23/23]
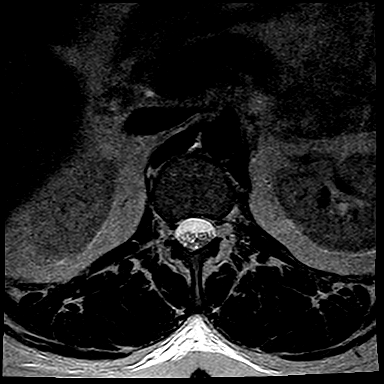

[Series 14: T1 · axial · 4.0mm · 0.47mm/px · z∈[-147,+55]mm · 8 of 23 slices shown (2 of 2)]
[im 1/23]
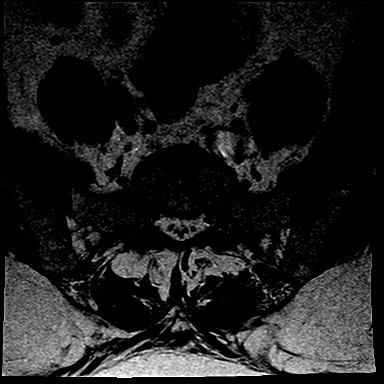
[im 5/23]
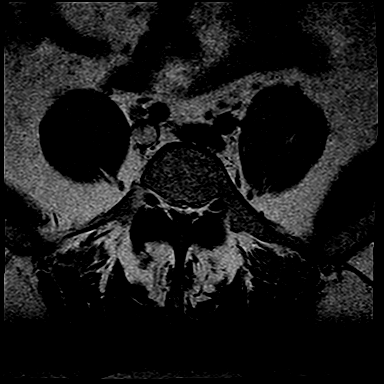
[im 7/23]
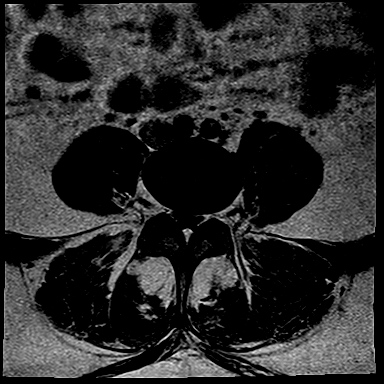
[im 9/23]
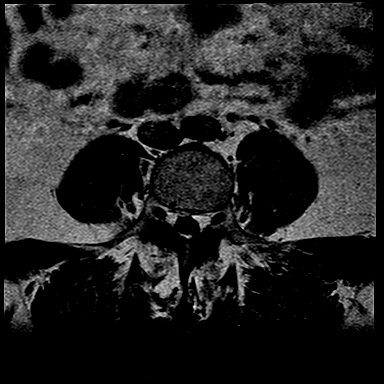
[im 14/23]
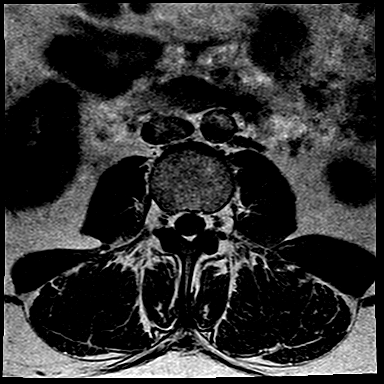
[im 16/23]
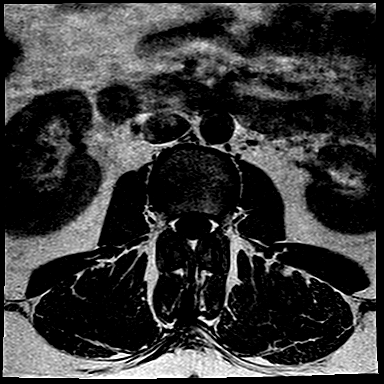
[im 18/23]
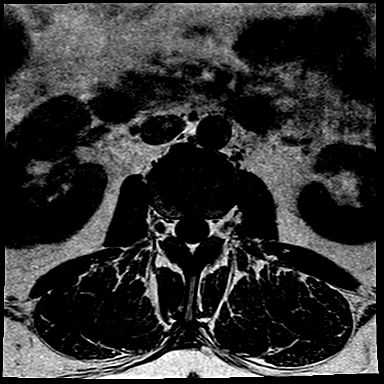
[im 23/23]
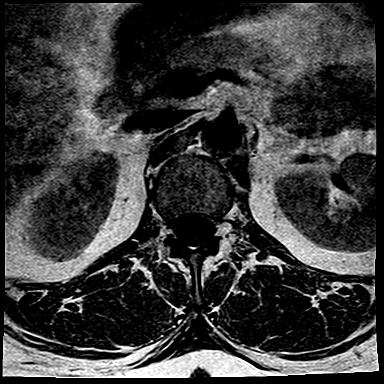

[40 of 48 positions shown; findings below may reference images not displayed]

FINDINGS: Vertebral bodies are normal in height, alignment and signal intensity. There is no acute fracture or subluxation. Distal spinal cord is normal in signal intensity and terminates normally at T12-L1 disc space level. Spinal canal is congenitally narrow.

L1-2, L2-3 and L3-4 levels are unremarkable.

At L4-5 level, there is a small broad-based central disc bulge, mildly effacing the ventral thecal sac. There is mild bilateral neural foraminal stenosis from facet arthropathy without nerve root impingement.

At L5-S1 level, there is no significant disc herniation. There is severe spinal stenosis from epidural lipomatosis. There is mild bilateral neural foraminal stenosis from facet arthropathy without nerve root impingement.

Paraspinal soft tissues are unremarkable.
IMPRESSION: 1.
Small central disc bulge at L4-5 level, mildly effacing the ventral thecal sac. 
2.
Severe spinal stenosis at L5-S1 level from epidural lipomatosis. 
3.
Mild multilevel neural foraminal stenosis as detailed above.

------------- REPORT [id number] -------------
EXAM:  MRI LUMBAR SPINE WITHOUT CONTRAST
FINDINGS: Vertebral bodies are normal in height, alignment and signal intensity. There is no acute fracture or subluxation. Distal spinal cord is normal in signal intensity and terminates normally at T12-L1 disc space level. Spinal canal is congenitally narrow.

L1-2, L2-3 and L3-4 levels are unremarkable.

At L4-5 level, there is a small broad-based central disc bulge, mildly effacing the ventral thecal sac. There is mild bilateral neural foraminal stenosis from facet arthropathy without nerve root impingement.

At L5-S1 level, there is no significant disc herniation. There is severe spinal stenosis from epidural lipomatosis. There is mild bilateral neural foraminal stenosis from facet arthropathy without nerve root impingement.

Paraspinal soft tissues are unremarkable.
IMPRESSION: Small central disc bulge at L4-5 level, mildly effacing the ventral thecal sac. 

Severe spinal stenosis at L5-S1 level from epidural lipomatosis. 

Mild multilevel neural foraminal stenosis as detailed above.

## 2019-05-22 IMAGING — MG 3D SCREENING MAMMO BIL W/CAD
5 series · 7 of 24 positions shown · non-contrast
Comparison: 03/07/2017 and 12/29/2015.

------------- REPORT [id number] -------------
[facility]
[address]
Daina Ms.ROBIKA, EPERJESI:
We wish to report the following on your recent mammography examination. We are sending a report to your referring physician or other health care provider. 
(       Normal/Negative:
No evidence of cancer.
This statement is mandated by the [place], Department of Health.
Your examination was performed by one of our technologists, who are registered radiological technologists and also specially certified in mammography:
___
Parlak, Edaly (M)
Nepomuceno, Martinez (M)

Your mammogram was interpreted by our radiologist.
( 
Sofeine Made, M.D.
(Annual Breast Examination by a physician or other health care provider
(Annual Mammography Screening beginning at age 40
(Monthly Breast Self Examination
------------- REPORT [id number] -------------
OXENDINE, JENNAH
EXAM:  3D BILATERAL ANNUAL SCREENING DIGITAL MAMMOGRAM WITH TOMOSYNTHESIS AND CAD
INDICATION: Screening.

[R CC · right · 0.10mm/px · 2 of 3 slices shown]
[im 1/3]
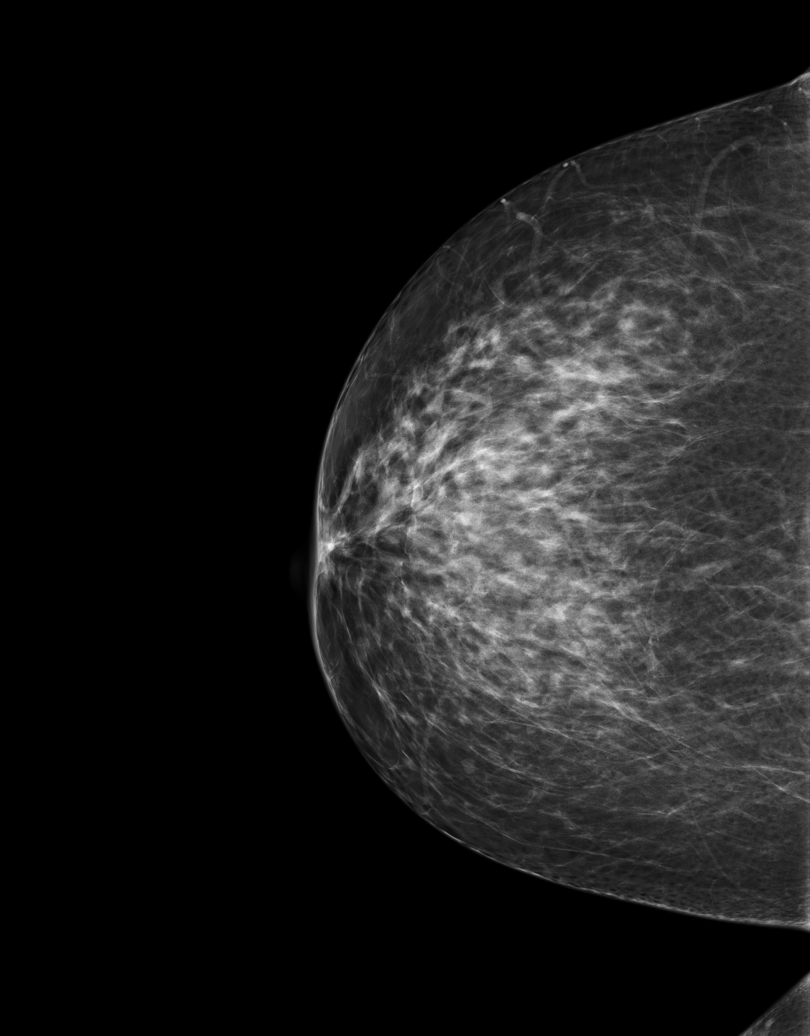
[im 3/3]
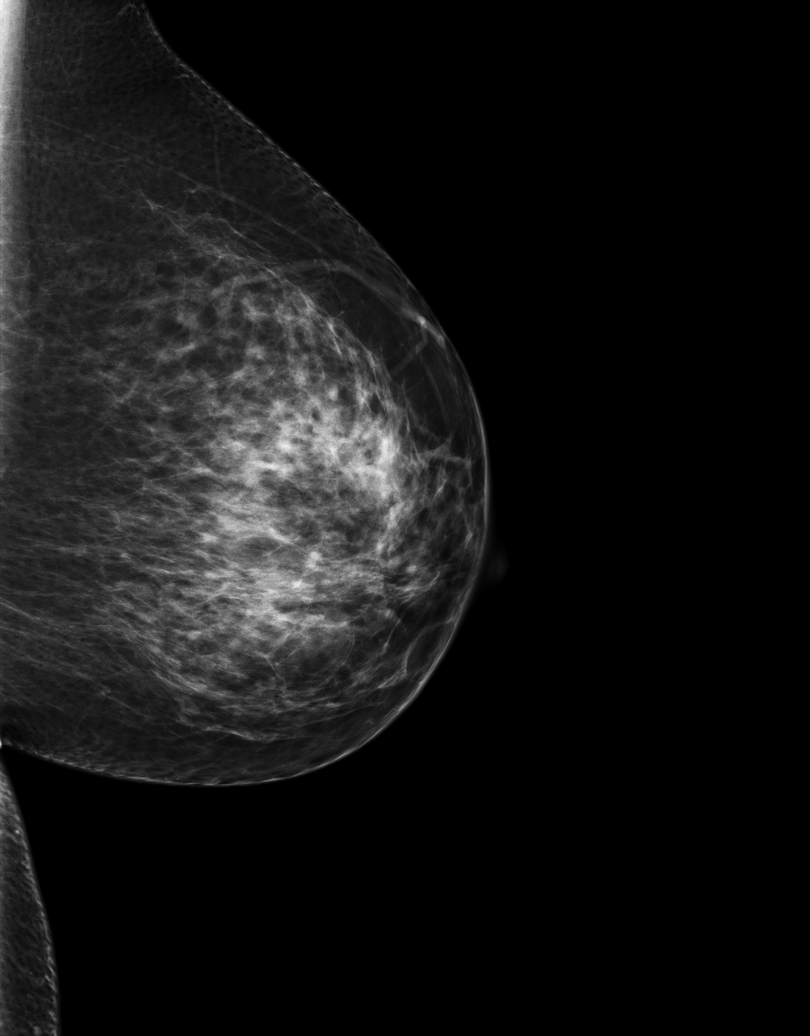

[3D SCREENING MAMMO BIL W/CAD · 2 acquisitions, 2 frames shown (1 of 2)]
[im 1/2]
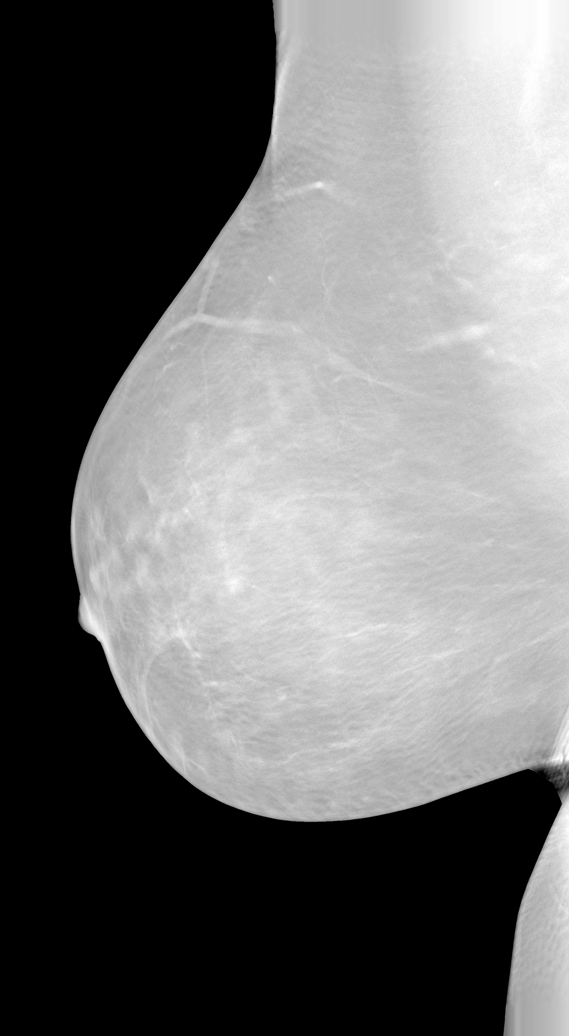
[im 2/2]
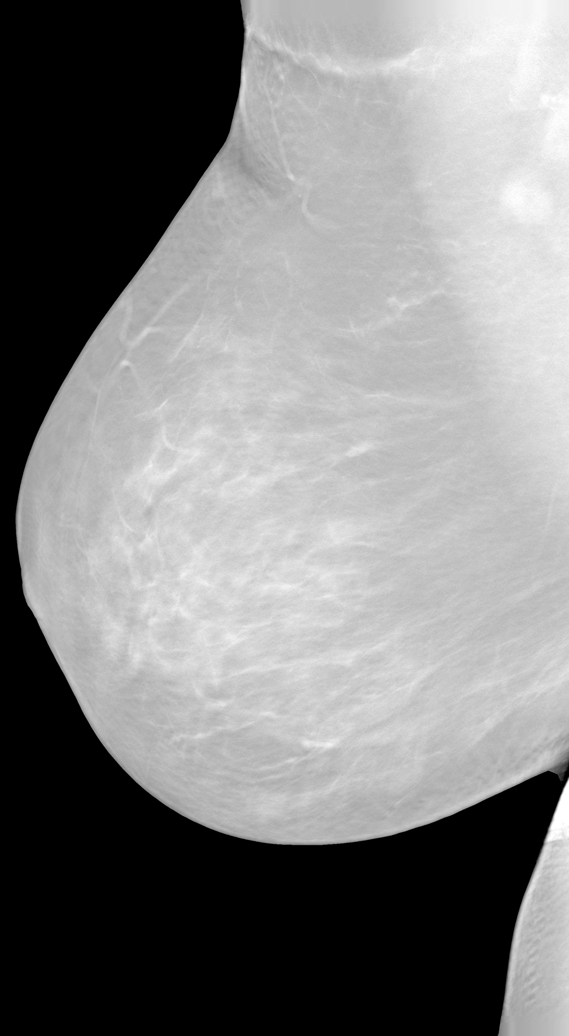

[3D SCREENING MAMMO BIL W/CAD (2 of 2) · tomo slice 15/93.0]
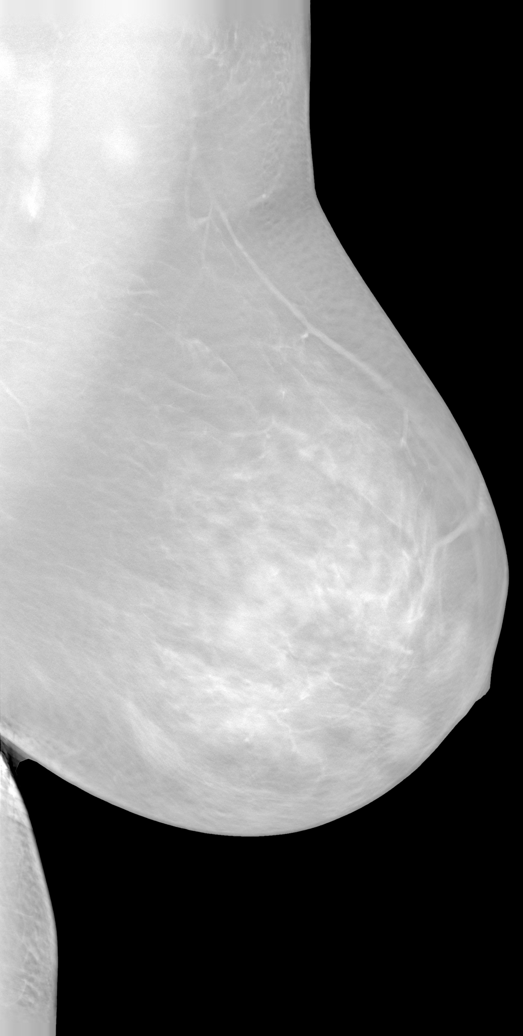

[R]
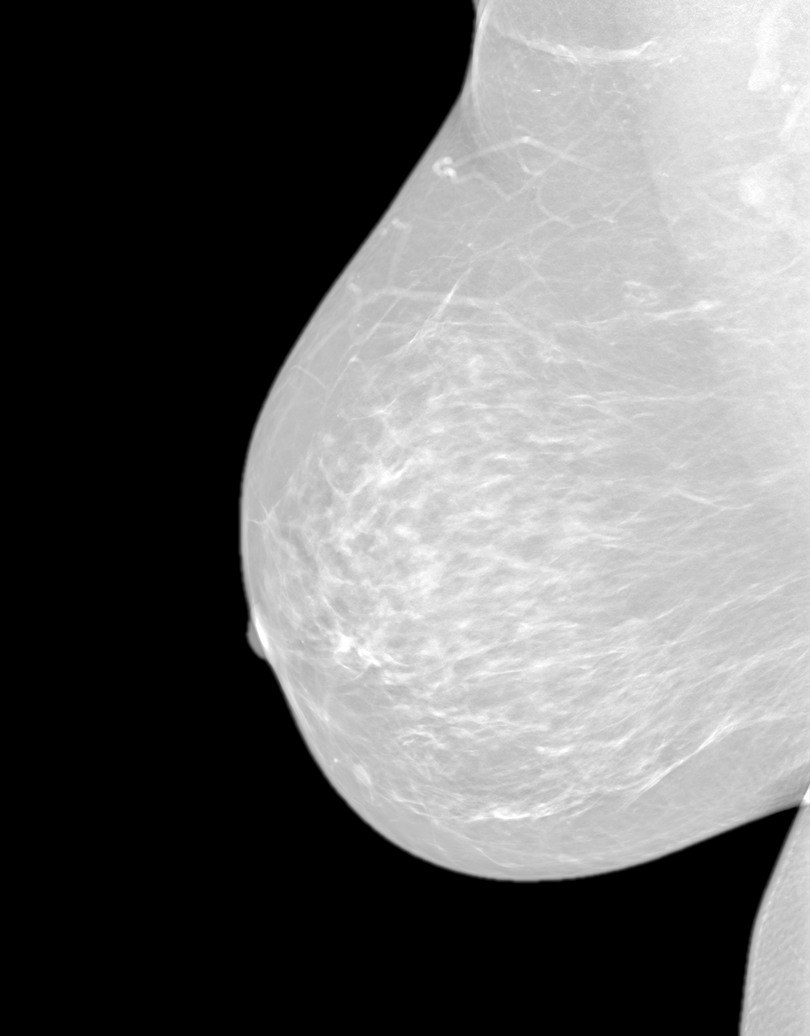

[L]
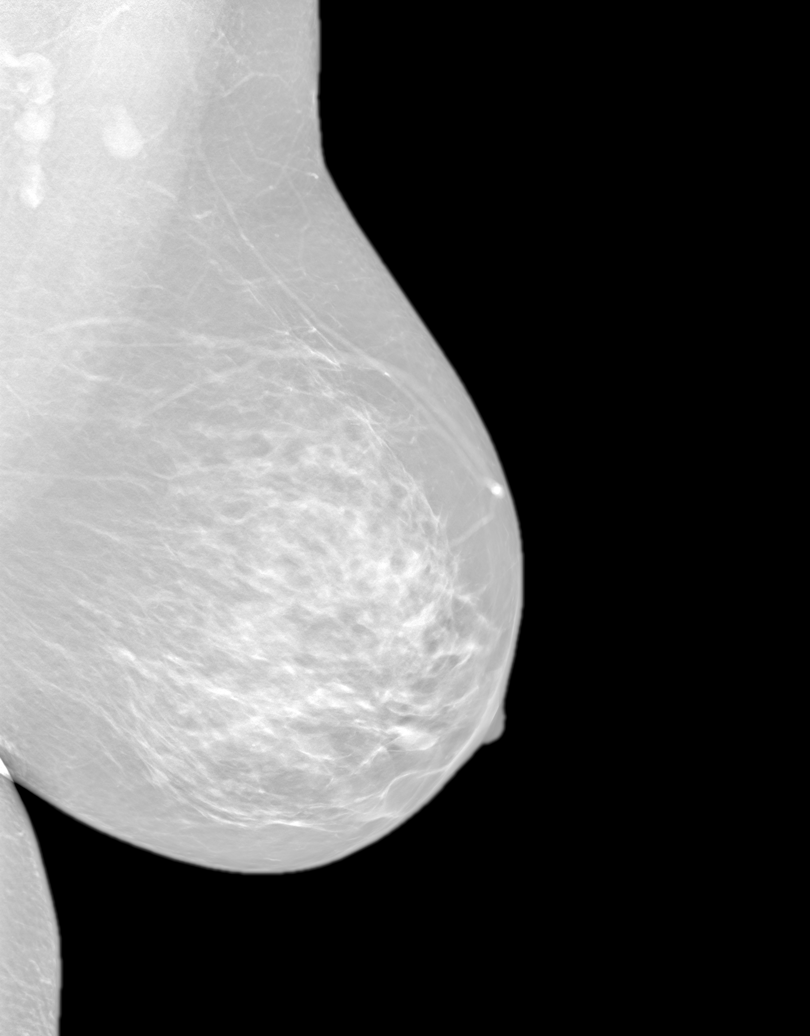

[7 of 24 positions shown; findings below may reference images not displayed]

FINDINGS: Breast parenchyma is heterogeneously dense.  There is no mass or suspicious cluster of microcalcifications.   There is no architectural distortion, skin thickening or nipple retraction.
IMPRESSION: 1.  BIRADS 2-Benign findings. Patient has been added in a reminder system with a target date for the next screening mammography.

2.  DENSITY CODE –  C (Heterogeneously dense).

Final Assessment Code:

Bi-Rads 2 

BI-RADS 0
Need additional imaging evaluation

BI-RADS 1
Negative mammogram

BI-RADS 2
Benign finding

BI-RADS 3
Probably benign finding; short-interval follow-up suggested

BI-RADS 4
Suspicious abnormality; biopsy should be considered

BI-RADS 5
Highly suggestive of malignancy; appropriate action should be taken

BI-RADS 6
Known biopsy-proven malignancy; appropriate action should be taken

NOTE:
In compliance with Federal regulations, the results of this mammogram are being sent to the patient.

## 2020-08-05 IMAGING — MG 3D SCREENING MAMMO BIL W/CAD & TOMO
5 series · 7 of 24 positions shown · non-contrast
Comparison: Prior mammograms dated 05/11/2020 and 01/09/2017.

------------- REPORT [id number] -------------
[facility]
[address]
Daina Ms.ROBIKA, EPERJESI:
We wish to report the following on your recent mammography examination. We are sending a report to your referring physician or other health care provider. 
(       Normal/Negative:
No evidence of cancer.
This statement is mandated by the [place], Department of Health.
Your examination was performed by one of our technologists, who are registered radiological technologists and also specially certified in mammography:
___
Parlak, Edaly (M)
Nepomuceno, Martinez (M)

Your mammogram was interpreted by our radiologist.
( 
Sofeine Made, M.D.
(Annual Breast Examination by a physician or other health care provider
(Annual Mammography Screening beginning at age 40
(Monthly Breast Self Examination
------------- REPORT [id number] -------------
﻿
                         #:
EXAM:  BILATERAL DIGITAL SCREENING MAMMOGRAM WITH 3D TOMOSYNTHESIS AND CAD
INDICATION: Asymptomatic 49-year-old with family history of breast cancer in her maternal grandmother.  Lifetime breast cancer risk 9.2%.
TECHNIQUE: 2D mammogram in CC projection both breasts. 3D Tomosynthesis of both breasts in the MLO projection. Virtual 2D images were obtained from the 3D images in the MLO projection.

[R]
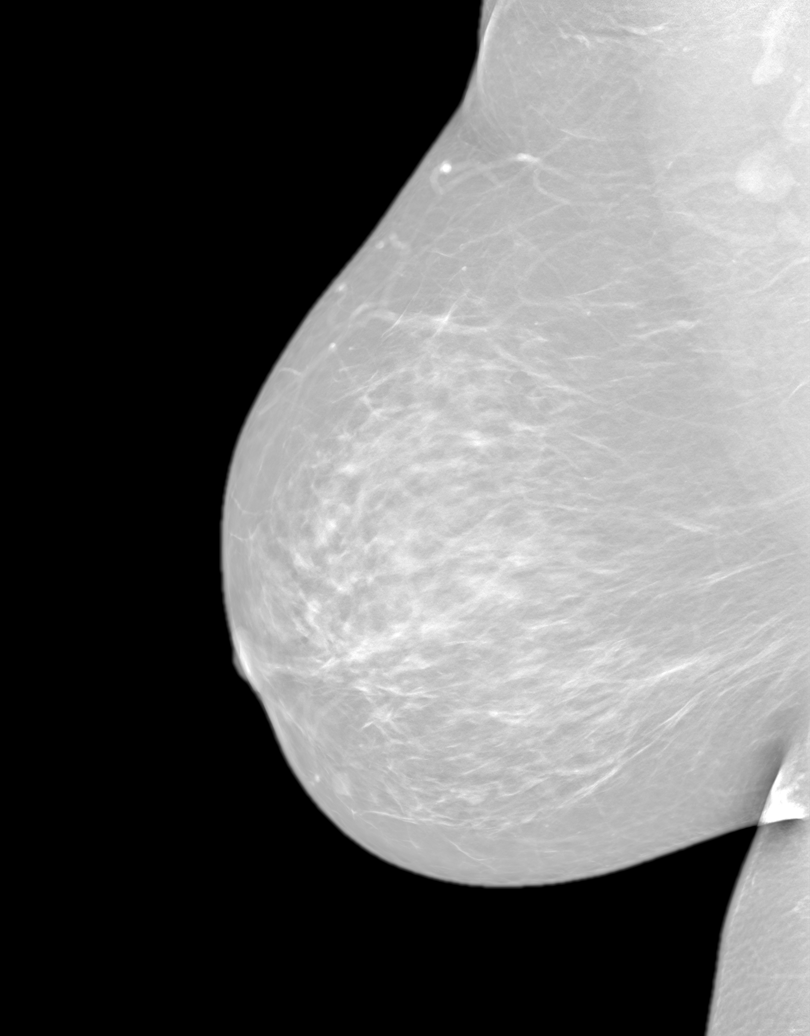

[L]
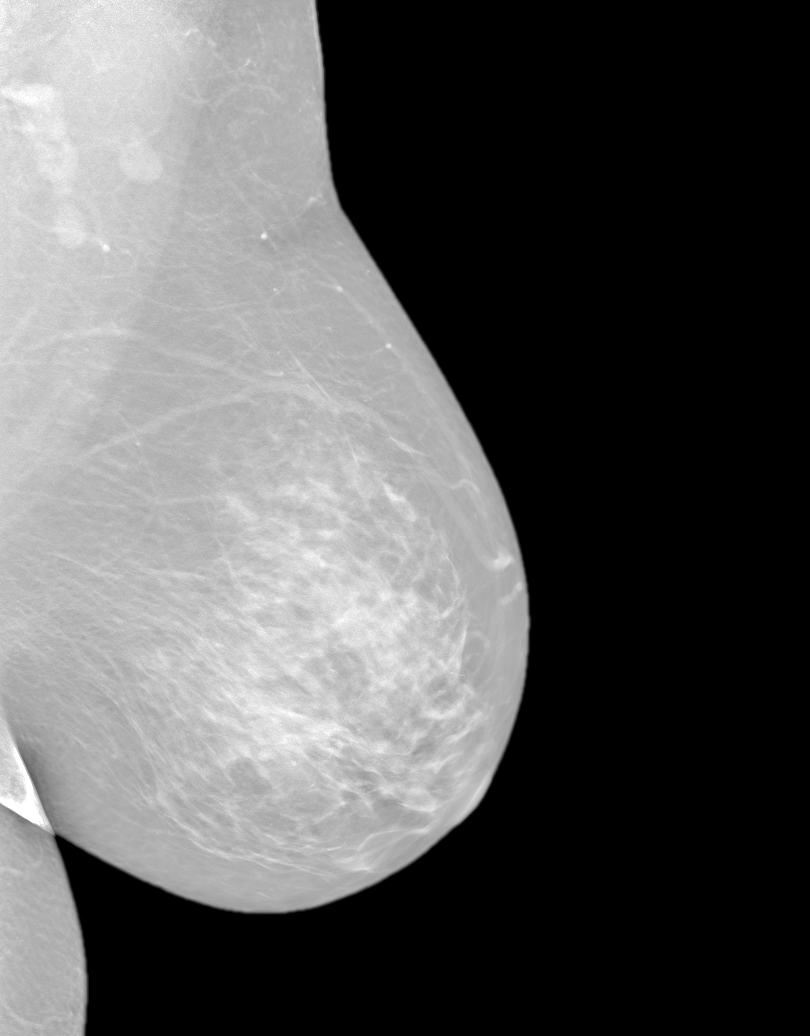

[Series 2389: R CC tomo · right · 2 of 3 slices shown]
[im 1/3]
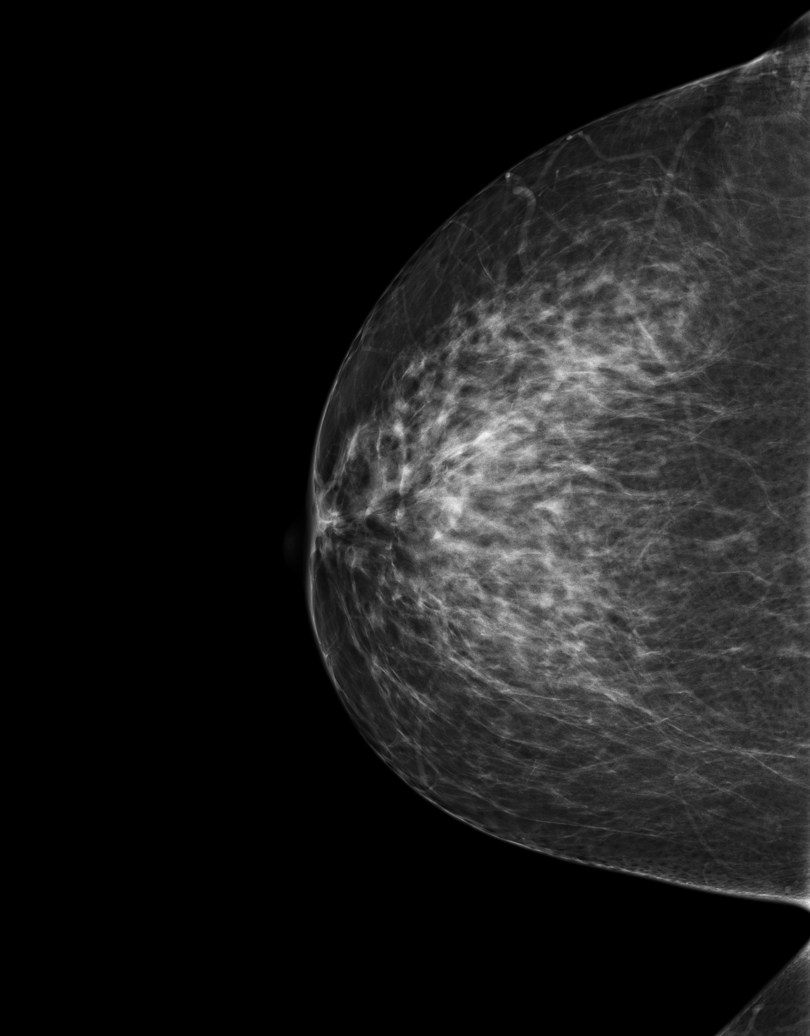
[im 3/3]
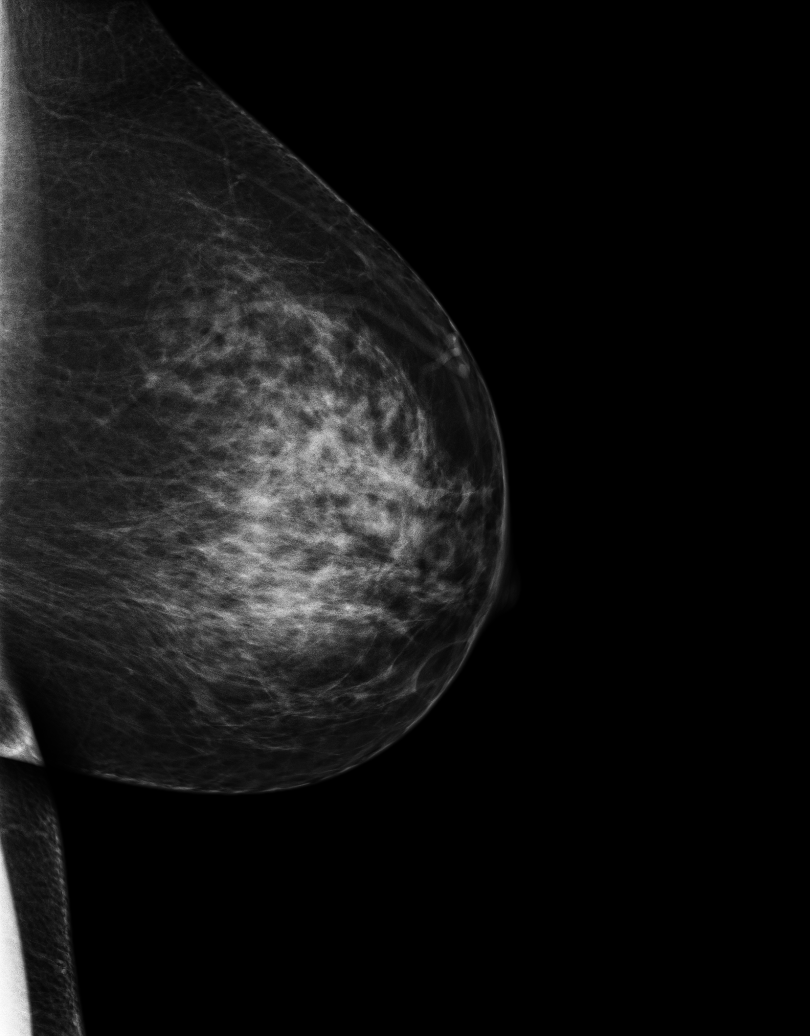

[Series 2391: 3D SCREENING MAMMO BIL W/CAD & TOMO tomo · 2 acquisitions, 2 frames shown (1 of 2)]
[im 1/2]
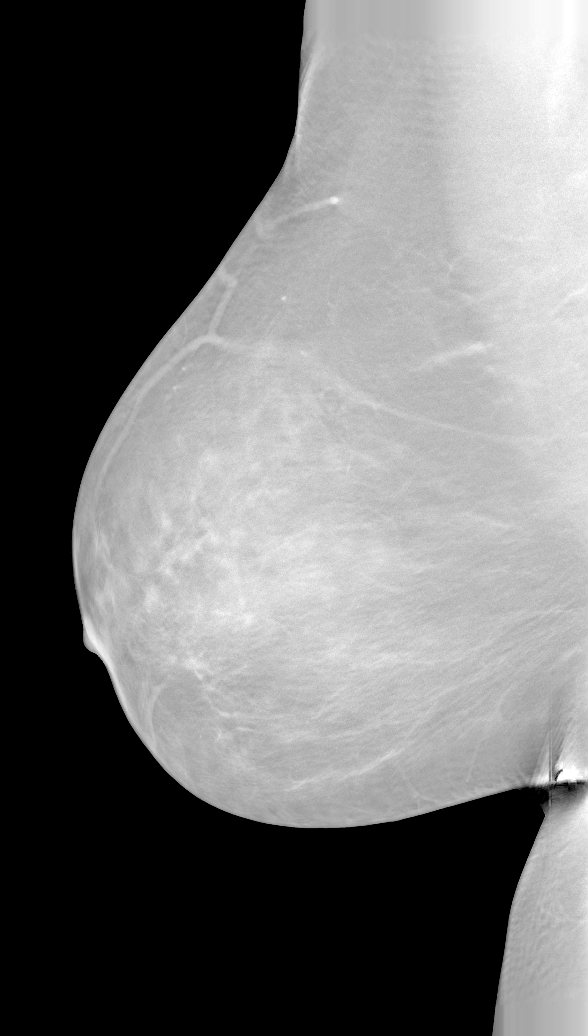
[im 2/2]
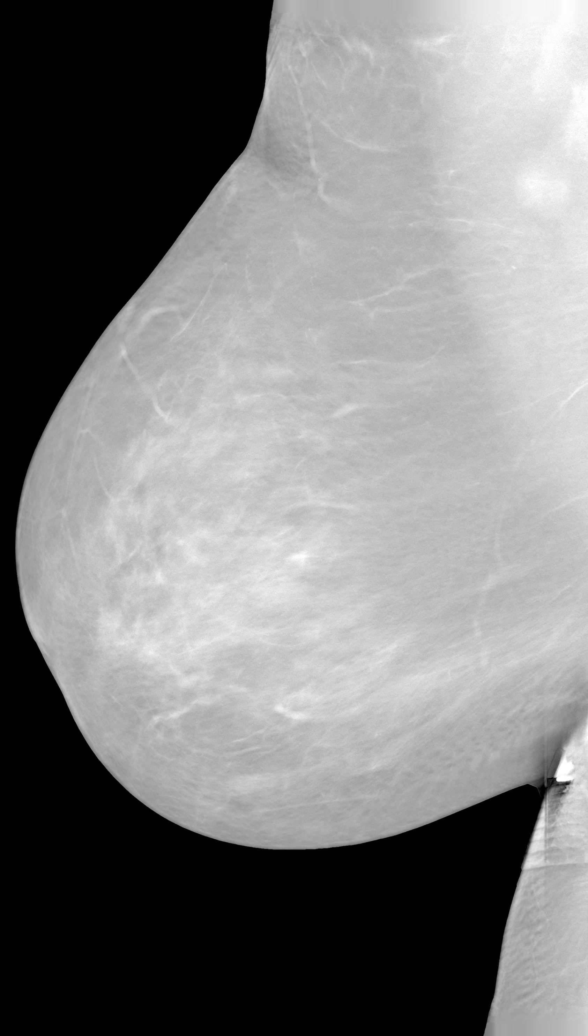

[3D SCREENING MAMMO BIL W/CAD & TOMO tomo (2 of 2) · tomo slice 16/98.0]
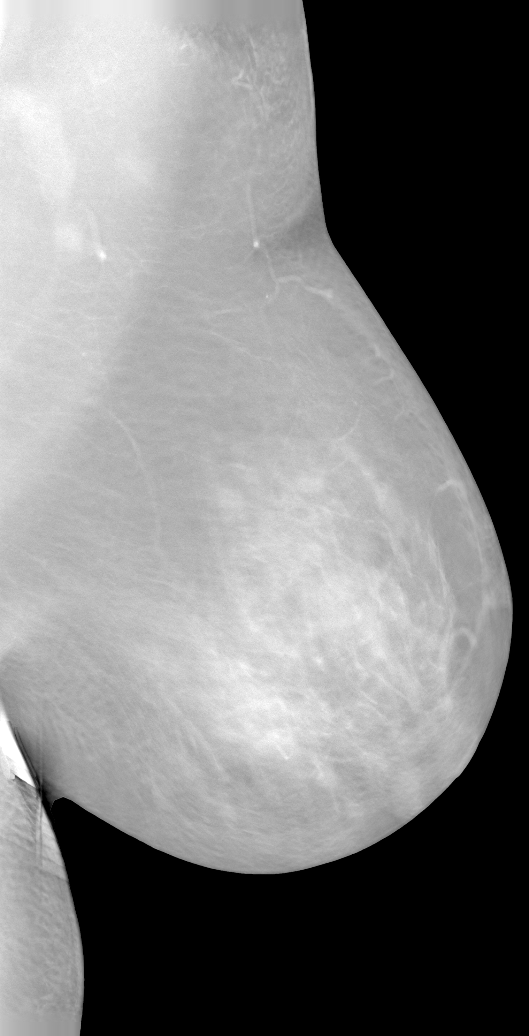

[7 of 24 positions shown; findings below may reference images not displayed]

FINDINGS: Heterogeneously dense breasts limit sensitivity to mammogram.  No well-defined mass or architectural changes are noted.  No abnormal calcific densities, skin changes, nipple changes or duct dilation are seen.  Stable benign lymph nodes in the axilla with fatty hila are noted.
IMPRESSION: 1.  Stable mammographic findings.  Clinical and mammographic followup at 12 months. 

2.  BIRADS 2-Benign findings. Patient has been added in a reminder system with a target date for the next screening mammography.

3.  DENSITY CODE – C (Heterogeneously dense). 

Final Assessment Code:

Bi-Rads 2 

BI-RADS 0
Need additional imaging evaluation.

BI-RADS 1
Negative mammogram.

BI-RADS 2
Benign finding.

BI-RADS 3
Probably benign finding; short-interval follow-up suggested.

BI-RADS 4
Suspicious abnormality; biopsy should be considered.

BI-RADS 5
Highly suggestive of malignancy; appropriate action should be taken.

BI-RADS 6
Known biopsy-proven malignancy; appropriate action should be taken.

NOTE:
In compliance with Federal regulations, the results of this mammogram are being sent to the patient.

## 2021-10-25 ENCOUNTER — Encounter (INDEPENDENT_AMBULATORY_CARE_PROVIDER_SITE_OTHER): Payer: Self-pay | Admitting: Surgery

## 2021-10-31 ENCOUNTER — Encounter (INDEPENDENT_AMBULATORY_CARE_PROVIDER_SITE_OTHER): Payer: Self-pay | Admitting: Surgery

## 2021-10-31 ENCOUNTER — Ambulatory Visit (INDEPENDENT_AMBULATORY_CARE_PROVIDER_SITE_OTHER): Admitting: Surgery

## 2021-10-31 ENCOUNTER — Other Ambulatory Visit: Payer: Self-pay

## 2021-10-31 VITALS — BP 125/68 | HR 84 | Temp 97.7°F | Resp 18 | Ht 64.0 in | Wt 218.0 lb

## 2021-10-31 DIAGNOSIS — Z1211 Encounter for screening for malignant neoplasm of colon: Secondary | ICD-10-CM

## 2021-11-01 ENCOUNTER — Other Ambulatory Visit (INDEPENDENT_AMBULATORY_CARE_PROVIDER_SITE_OTHER): Payer: Self-pay | Admitting: Surgery

## 2021-11-01 MED ORDER — SUTAB 1.479-0.188-0.225 GRAM TABLET
ORAL_TABLET | ORAL | 0 refills | Status: DC
Start: 2021-11-01 — End: 2022-07-13

## 2021-11-01 NOTE — H&P (Signed)
General Surgery  History and Physical    Date of Service:  11/01/2021  Meredith Brown, 51 y.o. female  Date of Admission:  (Not on file)  Date of Birth:  07-20-1971  PCP: Laurence Aly, CNP    Reason for admission:  Screening colonoscopy    HPI:  Meredith Brown is a 51 y.o. White female who is admitted for screening colonoscopy    Past Medical History:   Diagnosis Date   . Hypertension    . Mixed hyperlipidemia    . Sleep apnea       Past Surgical History:   Procedure Laterality Date   . COLONOSCOPY     . HX CHOLECYSTECTOMY        Social History     Tobacco Use   . Smoking status: Every Day     Packs/day: 0.50     Years: 5.00     Pack years: 2.50     Types: Cigarettes   . Smokeless tobacco: Never   Vaping Use   . Vaping Use: Never used   Substance Use Topics   . Alcohol use: Not Currently   . Drug use: Not Currently       Family Medical History:    None         Cannot display prior to admission medications because the patient has not been admitted in this contact.         Allergies   Allergen Reactions   . Ciprofloxacin Rash   . Erythromycin Rash   . Iv Contrast Rash   . Sulfa (Sulfonamides) Rash          @IPVITALS @       General: appropriate for age. in no acute distress.    Vital signs are present above and have been reviewed by me     HEENT: Atraumatic, Normocephalic.    Lungs: Nonlabored breathing with symmetric expansion.  Clear to auscultation bilaterally    Heart:Regular wth respect to rate and rythmn.    Abdomen:Soft. Nontender. Nondistended     Extremities:  Grossly normal with good range of motion    Neuro:  Grossly normal motor and sensory function    Psychiatric: Alert and oriented to person, place, and time. affect appropriate    Laboratory Data:     @RESULTRCNTWVUIP (24h)@    Imaging Studies:    No orders to display        Assessment/Plan:    Screening colonoscopy    This note was partially created using voice recognition software and is inherently subject to errors including those of syntax and  "sound alike " substitutions which may escape proof reading. In such instances, original meaning may be extrapolated by contextual derivation.    , MD, MBA, FACS

## 2022-01-12 IMAGING — MG 3D SCREENING MAMMO BIL AND TOMO
4 series · 7 of 23 positions shown · non-contrast
Comparison: 12/05/2021 and 09/20/2020.

------------- REPORT [id number] -------------
﻿

PERLAK, BRYLANT
EXAM:  3D SCREENING MAMMO BIL AND TOMO
INDICATION: Screening.

[L]
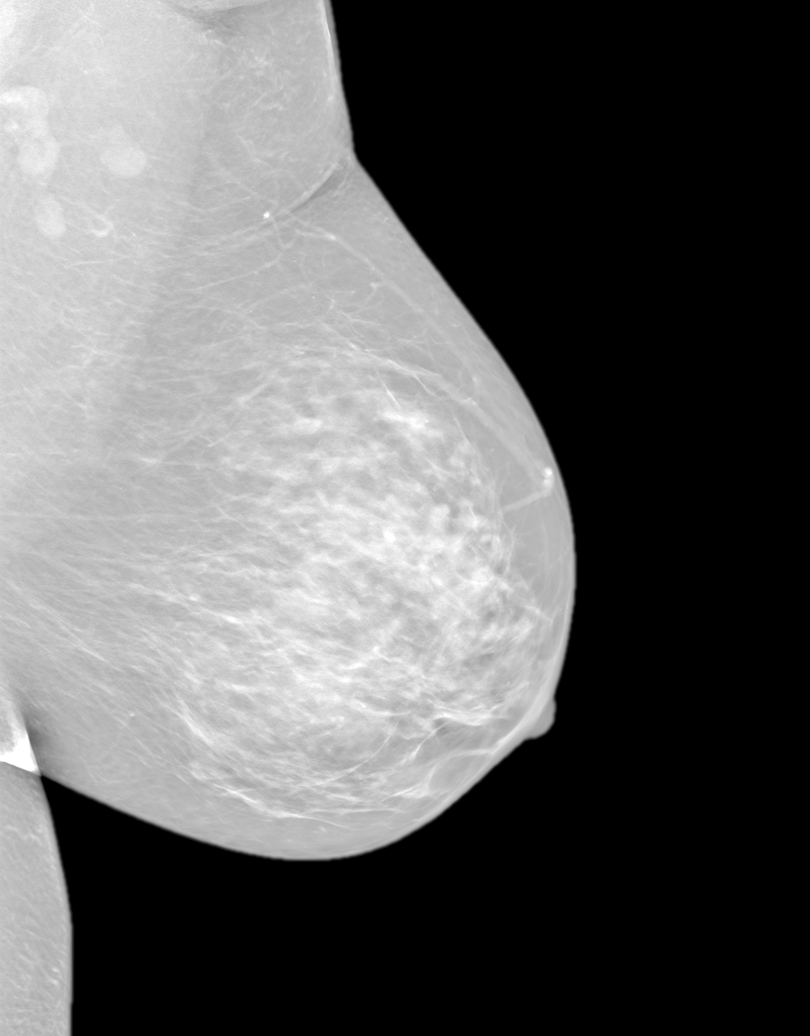

[R CC tomo · right · 0.10mm/px · 2 of 2 slices shown]
[im 1/2]
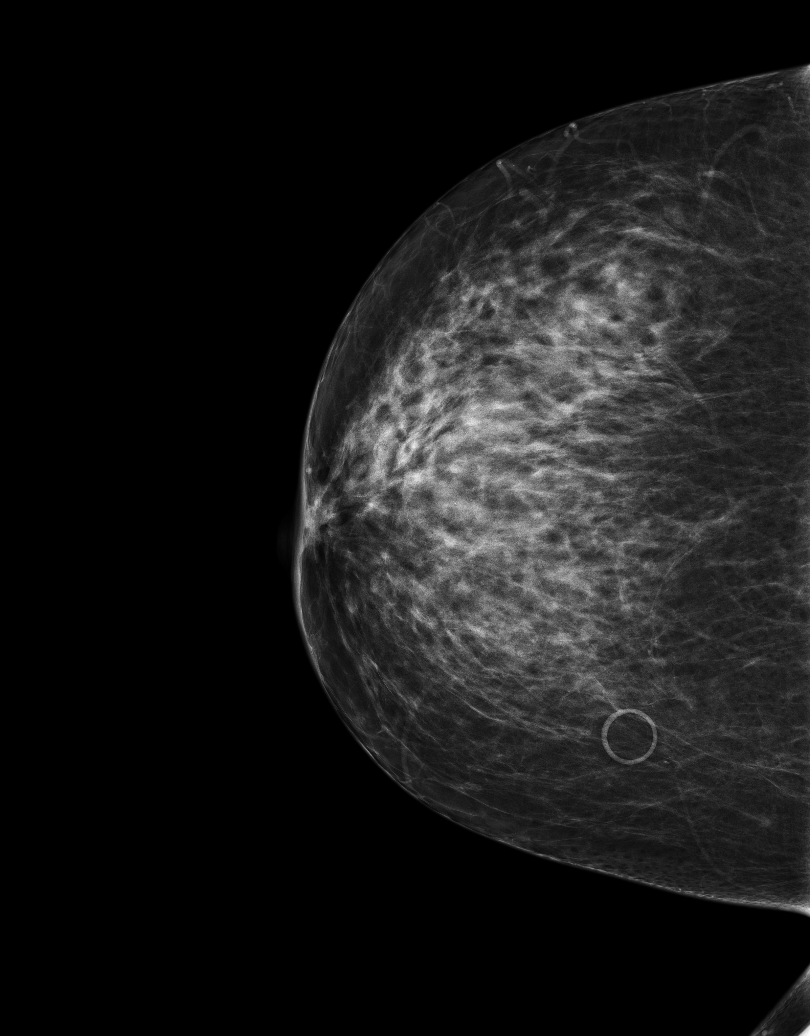
[im 2/2]
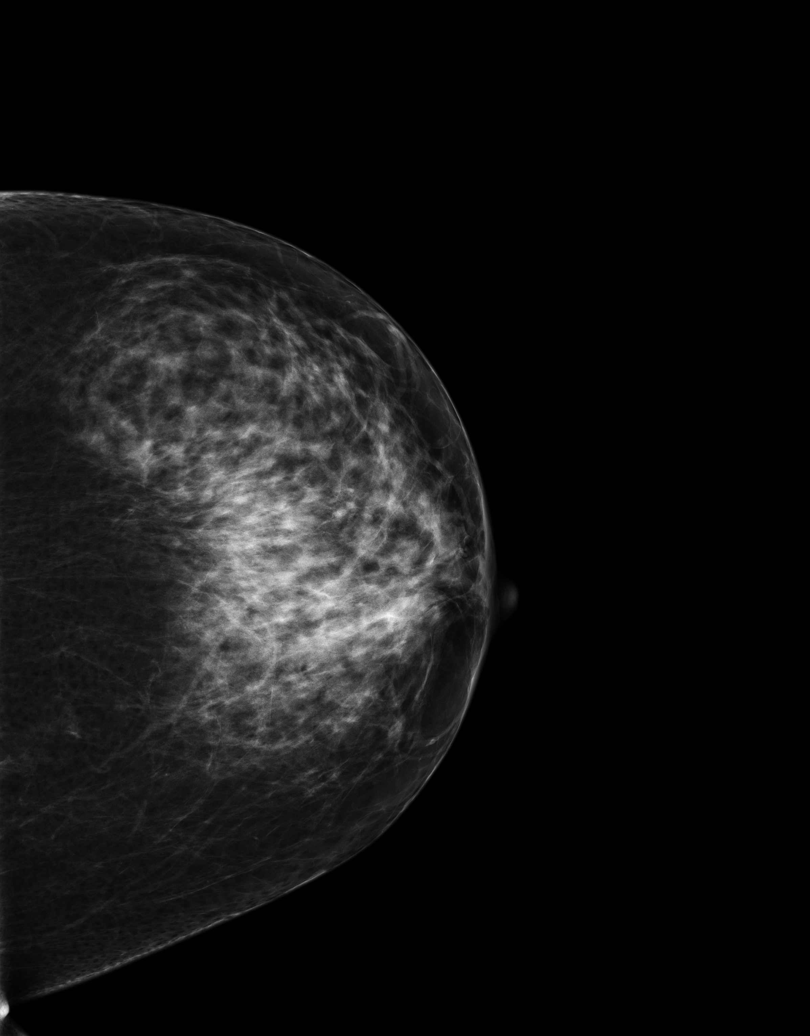

[3D SCREENING MAMMO BIL AND TOMO tomo · 2 acquisitions, 3 frames shown (1 of 2)]
[im 1/2]
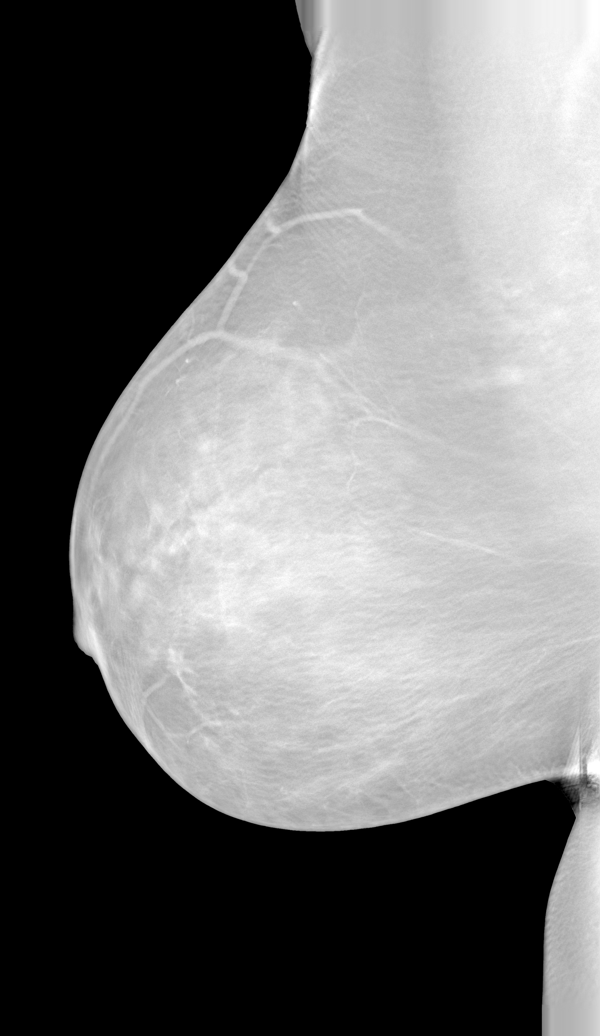
[im 2/2]
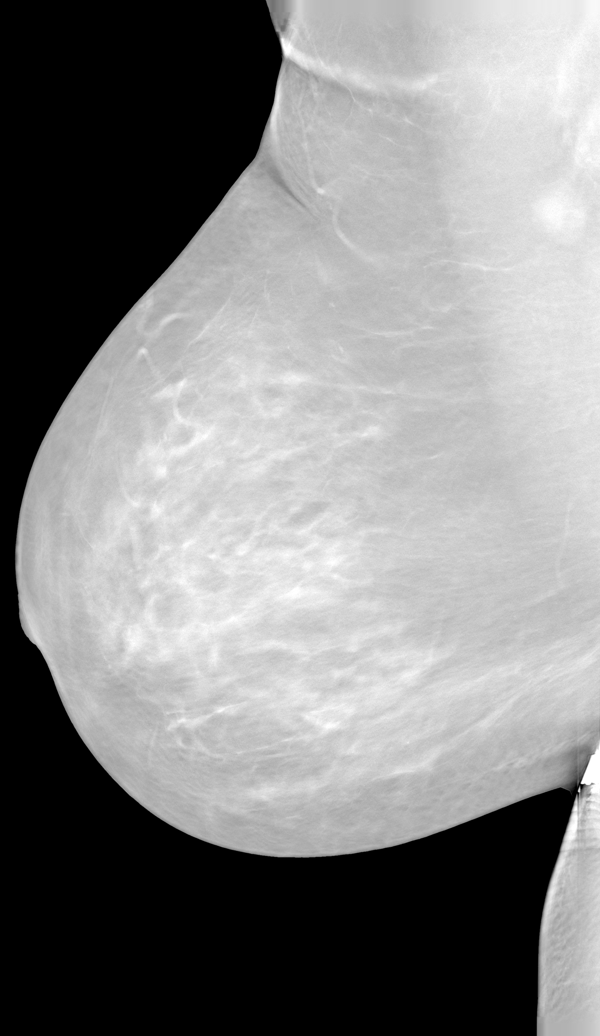
[im 2/2]
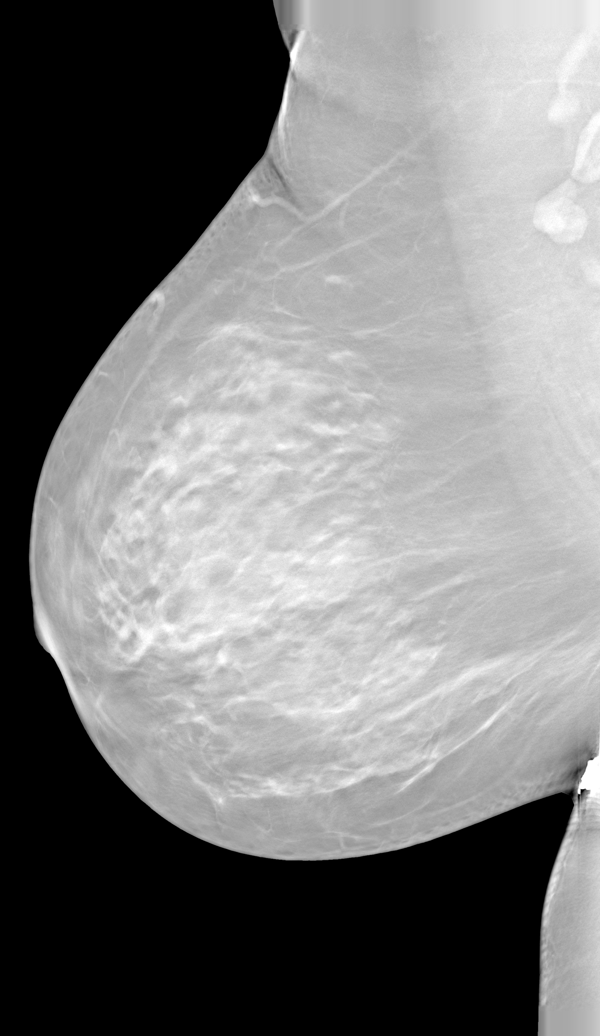

[3D SCREENING MAMMO BIL AND TOMO tomo (2 of 2) · tomo slice 14/88.0]
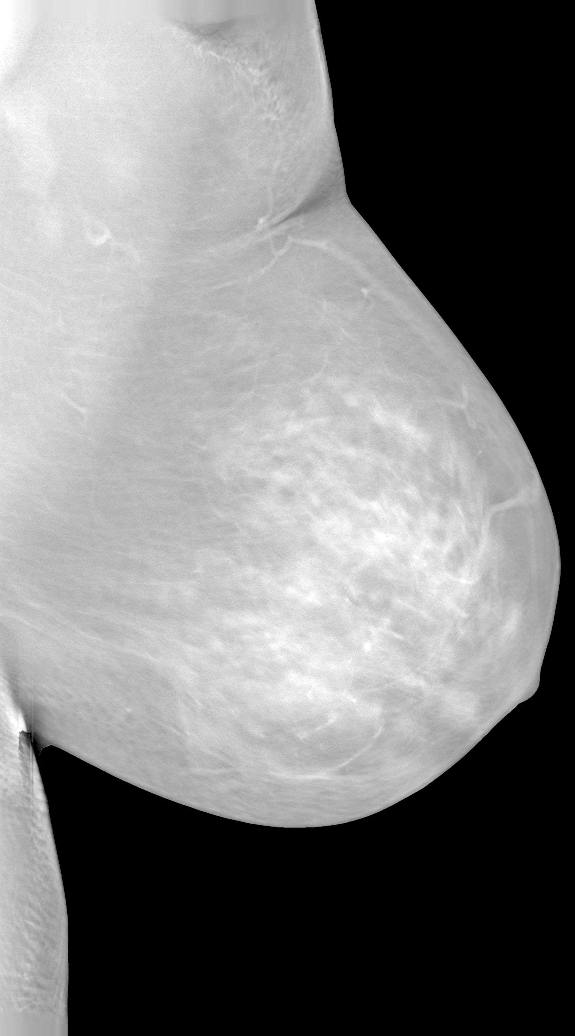

[7 of 23 positions shown; findings below may reference images not displayed]

FINDINGS: Breast parenchyma is heterogeneously dense.  There is no mass or suspicious cluster of microcalcifications. There is no architectural distortion, skin thickening or nipple retraction.
IMPRESSION: 1.  BIRADS 2-Benign findings. Patient has been added in a reminder system with a target date for the next screening mammography.

2.  DENSITY CODE –  C.

Final Assessment Code:

Bi-Rads 2 

BI-RADS 0
 Need additional imaging evaluation.

BI-RADS 1
 Negative mammogram.

BI-RADS 2
 Benign finding.

BI-RADS 3
 Probably benign finding; short-interval follow-up suggested.

BI-RADS 4
 Suspicious abnormality; biopsy should be considered.

BI-RADS 5
 Highly suggestive of malignancy; appropriate action should be taken.

BI-RADS 6
 Known biopsy-proven malignancy; appropriate action should be taken.

NOTE:
In compliance with Federal regulations, the results of this mammogram are being sent to the patient.

------------- REPORT [id number] -------------
[facility] [place]
[address]
Tiger Ms.MDN, JABBERI:
We wish to report the following on your recent mammography examination. We are sending a report to your referring physician or other health care provider. 
(       Normal/Negative:
No evidence of cancer.
This statement is mandated by the [place], Department of Health.
Your examination was performed by one of our technologists, who are registered radiological technologists and also specially certified in mammography:
___
Markland, Marjuan (M)

Your mammogram was interpreted by our radiologist.

( 
Collette Sedman, M.D.

(Annual Breast Examination by a physician or other health care provider
(Annual Mammography Screening beginning at age 40
(Monthly Breast Self Examination

## 2022-01-26 ENCOUNTER — Inpatient Hospital Stay (HOSPITAL_COMMUNITY): Admission: RE | Admit: 2022-01-26 | Source: Ambulatory Visit | Admitting: Surgery

## 2022-01-26 ENCOUNTER — Encounter (HOSPITAL_COMMUNITY): Admission: RE | Payer: Self-pay | Source: Ambulatory Visit

## 2022-01-26 SURGERY — COLONOSCOPY
Anesthesia: General

## 2022-02-16 IMAGING — MR MRI LUMBAR SPINE WITHOUT CONTRAST
5 of 6 series · 32 of 48 positions shown · IV contrast (gadolinium)
Comparison: MRI dated 03/27/2019.

﻿EXAM:  52453   MRI LUMBAR SPINE WITHOUT CONTRAST
INDICATION: Lower back pain.
TECHNIQUE: Multiplanar multisequential MRI of the lumbar spine was performed without gadolinium contrast.

[Series 5: T2 · sagittal · 4.0mm · 0.94mm/px · 6 of 13 slices shown (1 of 3)]
[im 1/13]
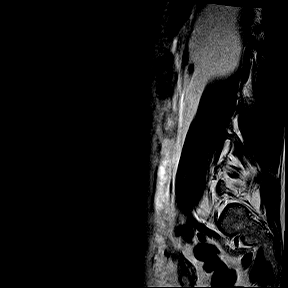
[im 3/13]
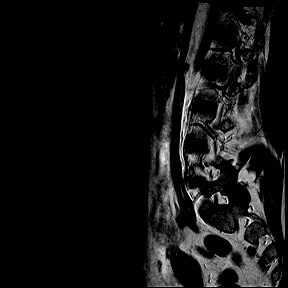
[im 5/13]
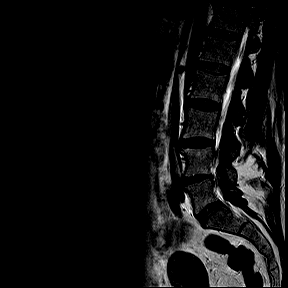
[im 8/13]
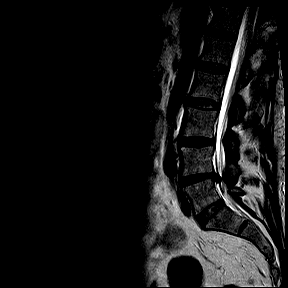
[im 10/13]
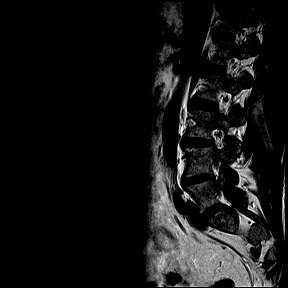
[im 13/13]
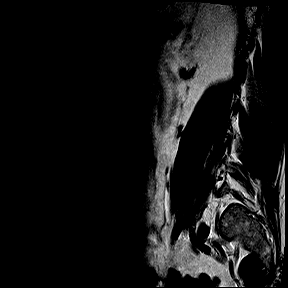

[Series 6: T1 · sagittal · 4.0mm · 0.94mm/px · 6 of 13 slices shown (1 of 2)]
[im 1/13]
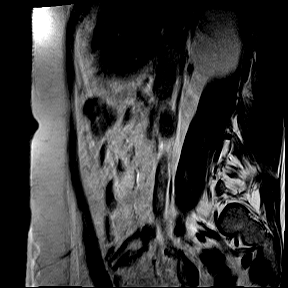
[im 3/13]
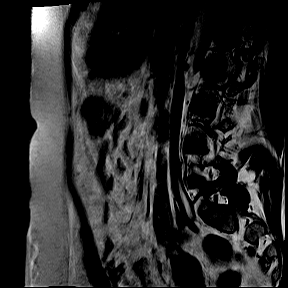
[im 5/13]
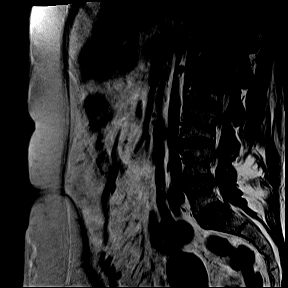
[im 8/13]
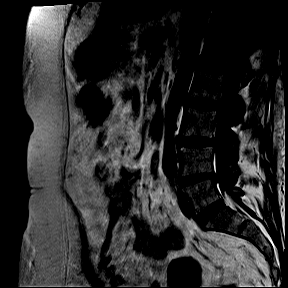
[im 10/13]
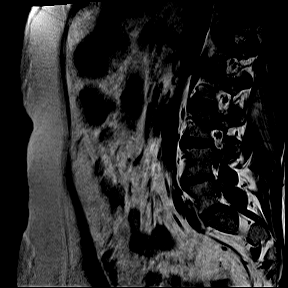
[im 13/13]
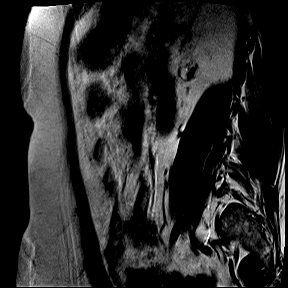

[Series 8: T2 · axial · 4.0mm · 0.52mm/px · z∈[-92,+118]mm · 11 of 23 slices shown (2 of 3)]
[im 1/23]
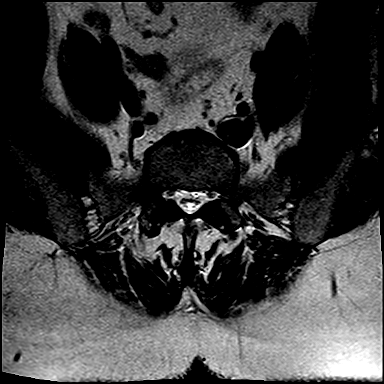
[im 3/23]
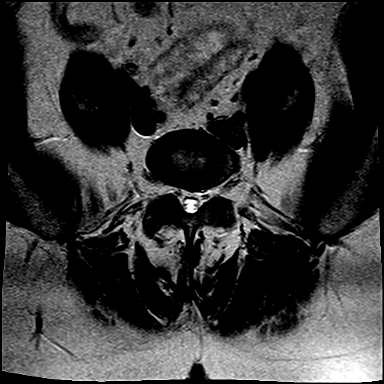
[im 5/23]
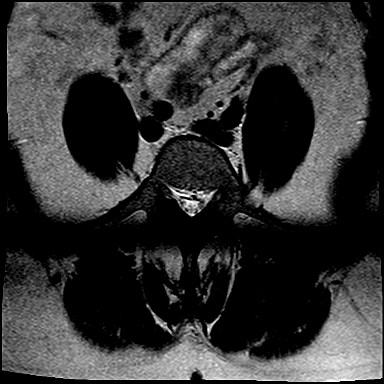
[im 7/23]
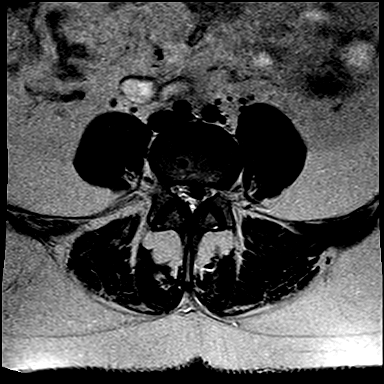
[im 9/23]
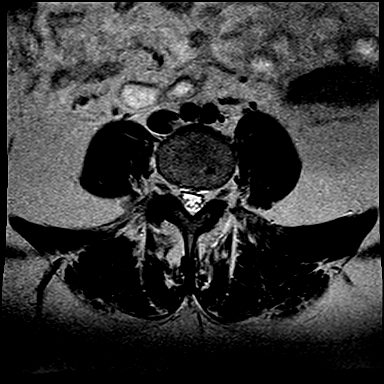
[im 12/23]
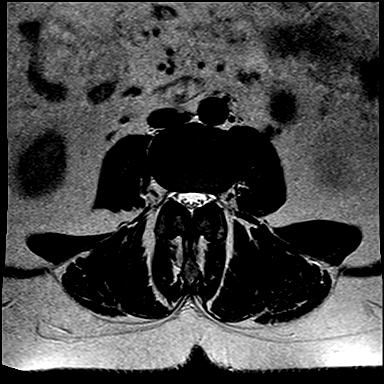
[im 14/23]
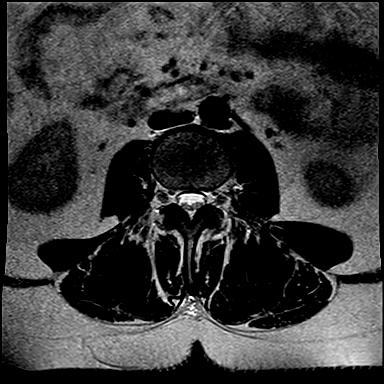
[im 16/23]
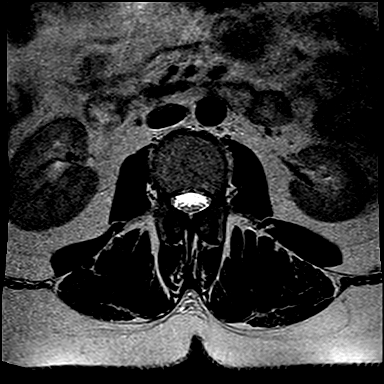
[im 18/23]
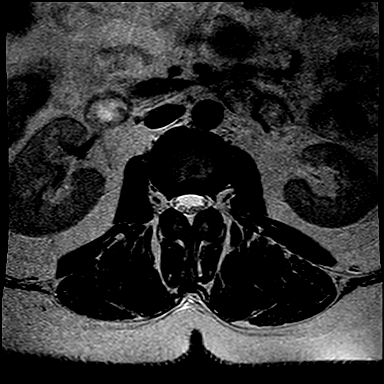
[im 20/23]
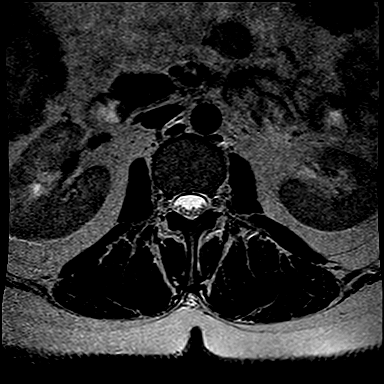
[im 23/23]
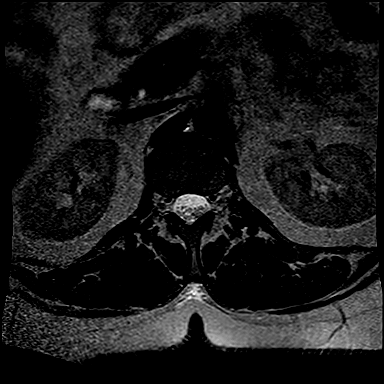

[Series 9: T1 · axial · 4.0mm · 0.52mm/px · 1 of 23 slices shown (2 of 2)]
[im 1/23]
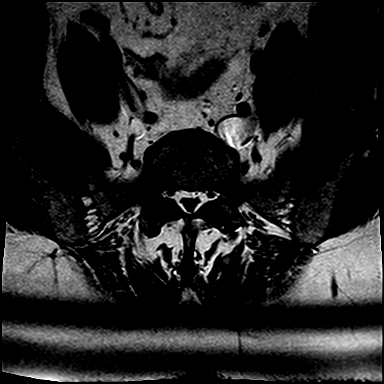

[Series 10: T2 · coronal · 5.0mm · 0.82mm/px · 8 of 18 slices shown (3 of 3)]
[im 1/18]
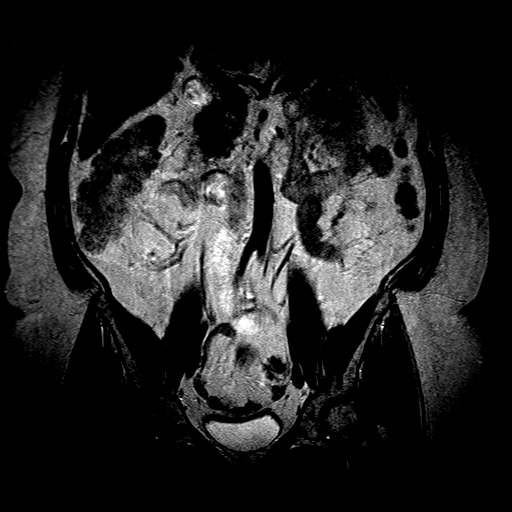
[im 3/18]
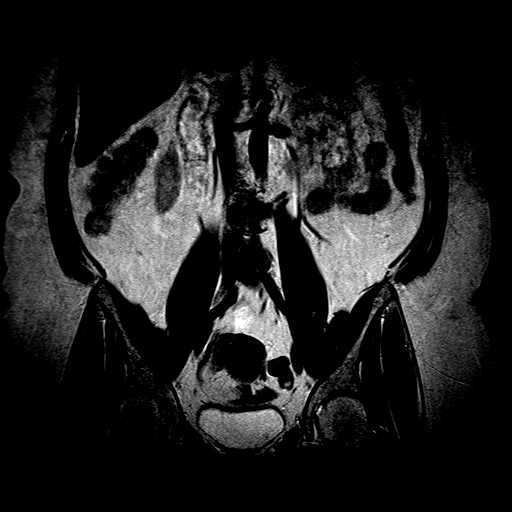
[im 5/18]
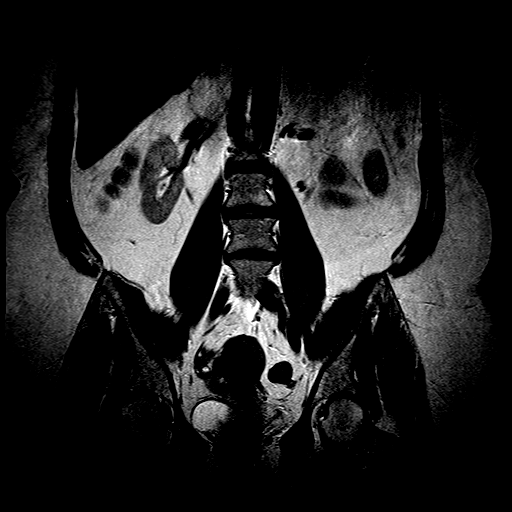
[im 8/18]
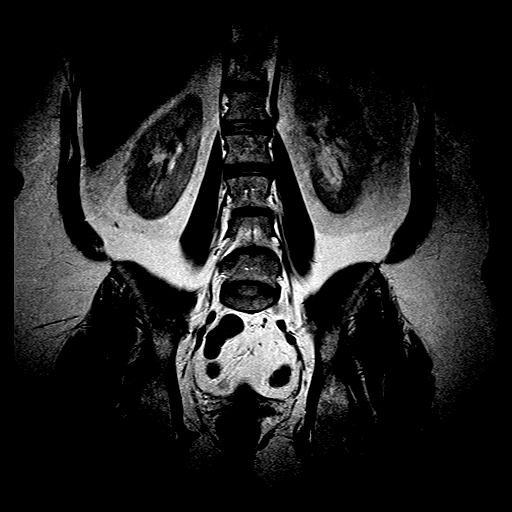
[im 10/18]
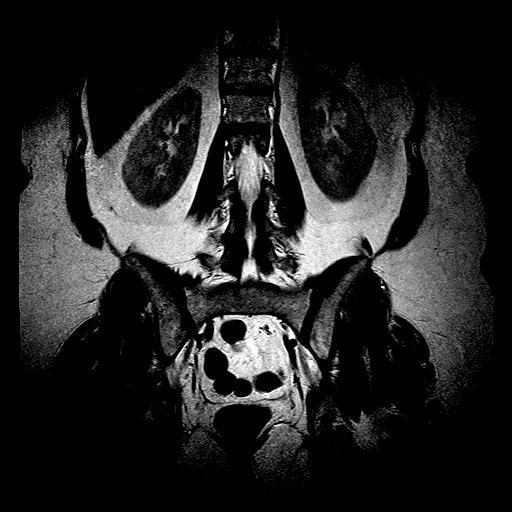
[im 13/18]
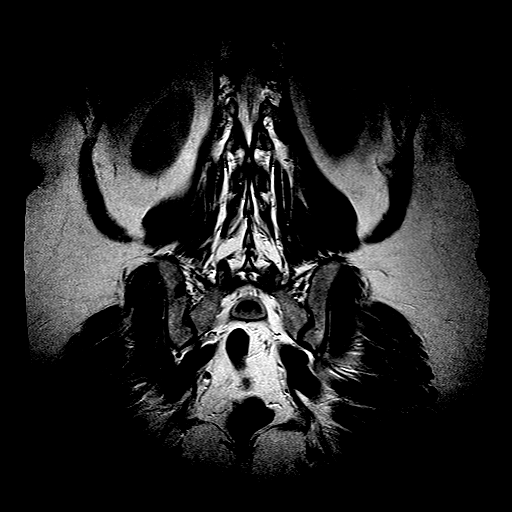
[im 15/18]
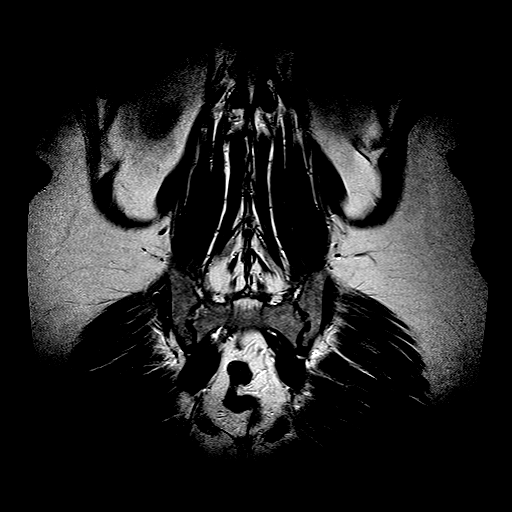
[im 18/18]
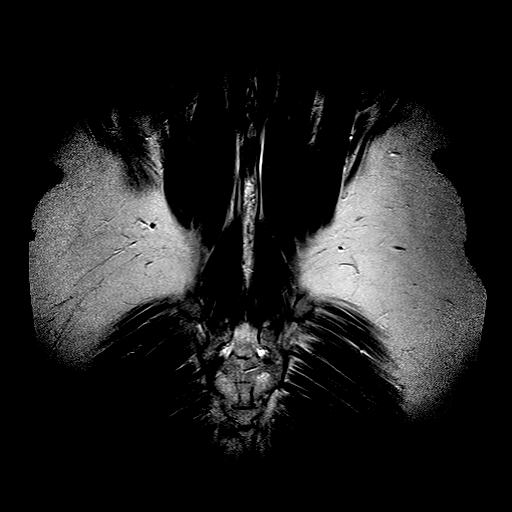

[32 of 48 positions shown; findings below may reference images not displayed]

FINDINGS: Vertebral bodies are normal in height, alignment and signal intensity. There is no acute fracture or subluxation. Distal spinal cord is normal in signal intensity and terminates normally at L1 vertebral body level.  Spinal canal is congenitally narrow. 

L1-2, L2-3 and L3-4 levels are unremarkable. 

At L4-5 level, there is moderate disc desiccation.  There is a moderate to large broad-based central and left paracentral disc protrusion resulting in moderate to severe spinal and left lateral recess compression.  There is mild right neural foraminal stenosis from facet arthropathy without nerve root impingement.

L5-S1 level and paraspinal soft tissues are unremarkable.
IMPRESSION: Moderate to severe spinal and left lateral recess compression at L4-5 level from a moderate to large central and left paracentral disc protrusion.

## 2022-04-16 NOTE — H&P (Unsigned)
GENERAL SURGERY, [facility]  [address]  [place] [address]       Name: Meredith Brown MRN:  [id number]   Date: 04/17/2022 Age: 51 y.o. [birth year]      PCP: Darlyn Chamber, CNP     Subjective  Meredith Brown is a 51 y.o. year old female who presents for screening colonoscopy.  No current GI complaints.  No constipation.  No abdominal pain.  No rectal bleeding.  No unexplained weight loss.      Family history of colon cancer:  None    Negative blood thinner  Patient does take Wegovy (glp1 agonist)    Last colonoscopy:  None    Patient's referral to this office included a recent assessment by the referring provider.  This was reviewed by me for this unique office visit for the indication and intent of the referral as well as any pertinent medical or surgical history relevant to the patient's independent evaluation by me today.     Allergies   Allergen Reactions    Ciprofloxacin Rash    Erythromycin Rash    Iv Contrast Rash    Sulfa (Sulfonamides) Rash      Current Outpatient Medications   Medication Sig    ALPRAZolam (XANAX) 0.5 mg Oral Tablet     citalopram (CELEXA) 40 mg Oral Tablet     hydroCHLOROthiazide (HYDRODIURIL) 25 mg Oral Tablet     losartan (COZAAR) 100 mg Oral Tablet     naproxen (NAPROSYN) 500 mg Oral Tablet     rosuvastatin (CRESTOR) 5 mg Oral Tablet     sod sulf-pot chloride-mag sulf (SUTAB) 1.479-0.188- 0.225 gram Oral Tablet Take 12 tablets with specified amount of water the evening prior to colonoscopy, as directed on the package. Take an additional 12 tablets with specified amount of water the morning of the colonoscopy, as directed on the package. Complete all tablets and required water at least 2 hours prior to procedure.          Objective:     There were no vitals filed for this visit.     General: appropriate for age. in no acute distress.    Vital signs are present above and have been reviewed by me     HEENT: Atraumatic,  Normocephalic.    Lungs: Nonlabored breathing with symmetric expansion    Heart:Regular wth respect to rate and rythmn.    Abdomen:Soft. Nontender. Nondistended and benign    Psychiatric: Alert and oriented to person, place, and time. affect appropriate    Assessment/Plan    ICD-10-CM    1. Screening for malignant neoplasm of colon  Z12.11        Colonoscopy scheduled for sometime in November    Discussed indications, risks, and benefits of colonoscopy with the patient.  Discussed the possibility of polypectomy, biopsies, and repeat possible examinations.  Risks discussed include bleeding, sedation risks, possibility of missed diagnosis of polyp malignancy, and remote possibility of perforation and/or death.  All questions answered and informed consent clearly obtained.    Office Visit was used for detailed explanation procedure and its indications, review of the patient's medications relative to the time before and after the procedure, and the effects of the associated medical conditions that affect the procedure preparation and procedure itself.    Gene B. Duremdes MD, MBA, FACS

## 2022-04-17 ENCOUNTER — Ambulatory Visit (INDEPENDENT_AMBULATORY_CARE_PROVIDER_SITE_OTHER): Admitting: Surgery

## 2022-04-17 ENCOUNTER — Other Ambulatory Visit: Payer: Self-pay

## 2022-04-17 ENCOUNTER — Encounter (INDEPENDENT_AMBULATORY_CARE_PROVIDER_SITE_OTHER): Payer: Self-pay | Admitting: Surgery

## 2022-04-17 VITALS — BP 148/74 | HR 77 | Temp 98.0°F | Ht 64.0 in | Wt 230.0 lb

## 2022-04-17 DIAGNOSIS — Z1211 Encounter for screening for malignant neoplasm of colon: Secondary | ICD-10-CM

## 2022-04-17 MED ORDER — SUTAB 1.479-0.188-0.225 GRAM TABLET
ORAL_TABLET | ORAL | 0 refills | Status: DC
Start: 2022-04-17 — End: 2022-07-13

## 2022-06-08 IMAGING — US US THYROID
1 series · 14 of 25 positions shown · non-contrast
Comparison: None.

﻿EXAM:  [id number]   US THYROID
INDICATION: 51-year-old female with history of thyromegaly and goiter.

[Series 1: us thyroid · 14 of 67 slices shown]
[im 1/67]
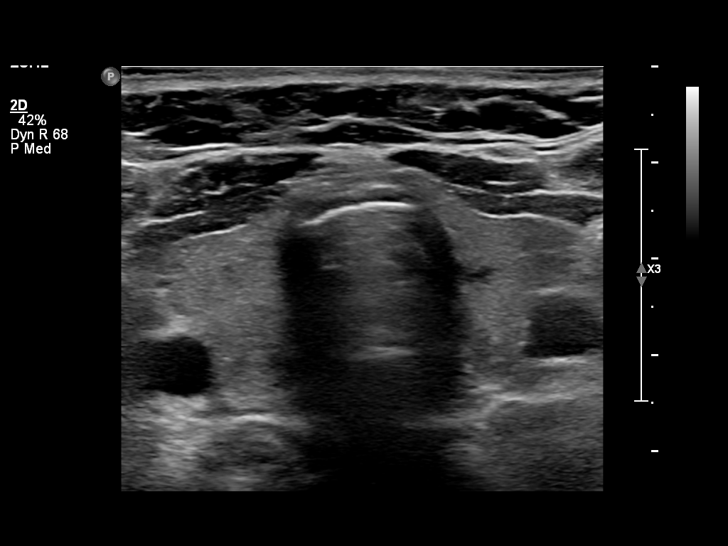
[im 6/67]
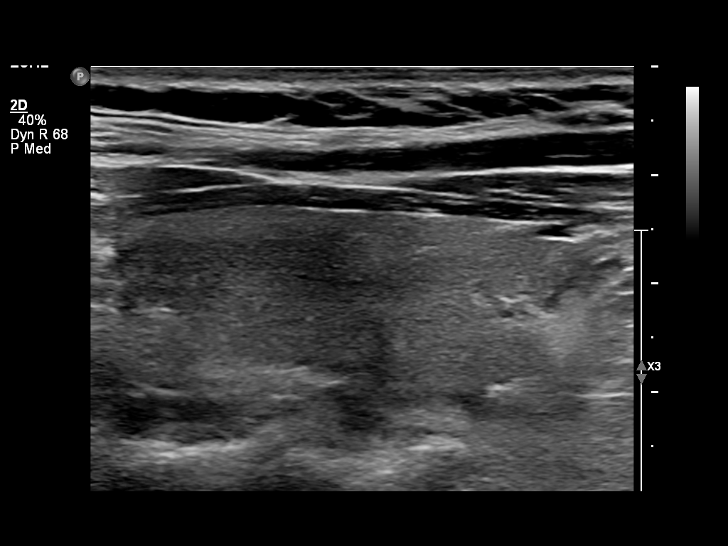
[im 12/67]
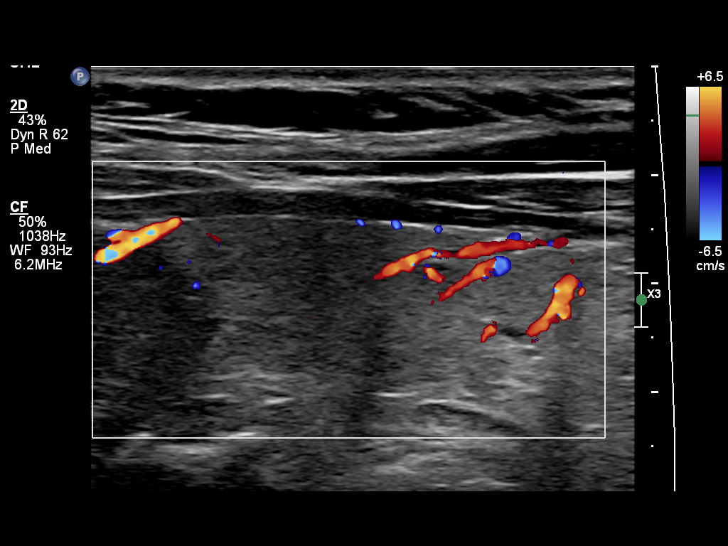
[im 17/67]
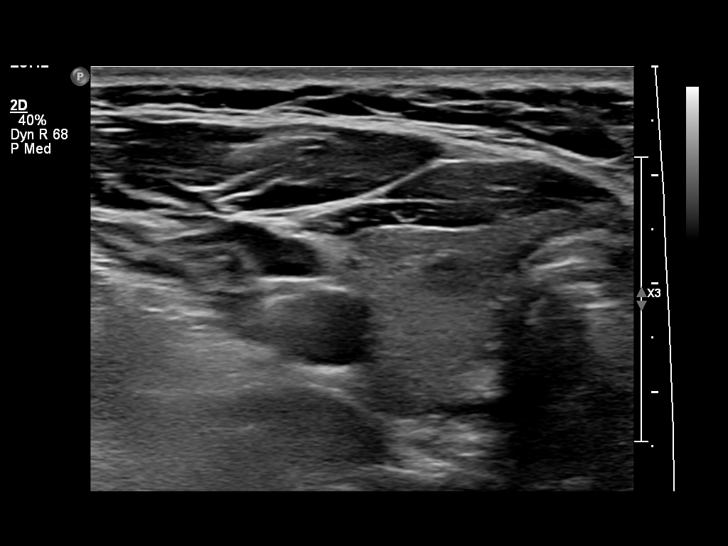
[im 23/67]
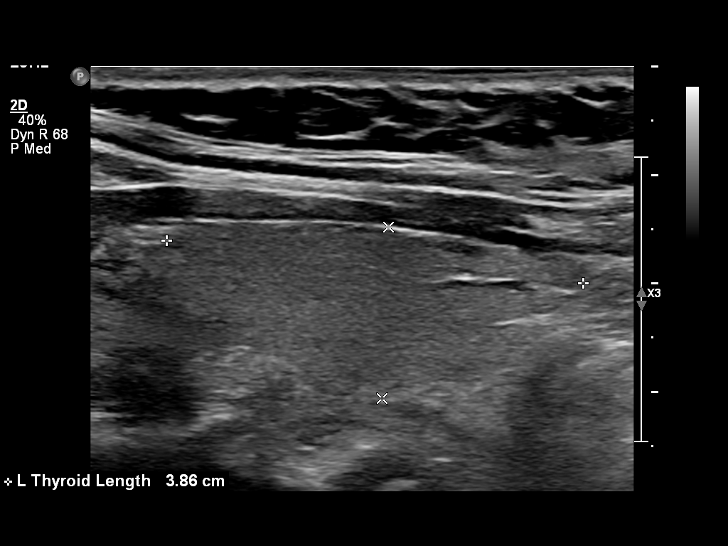
[im 25/67]
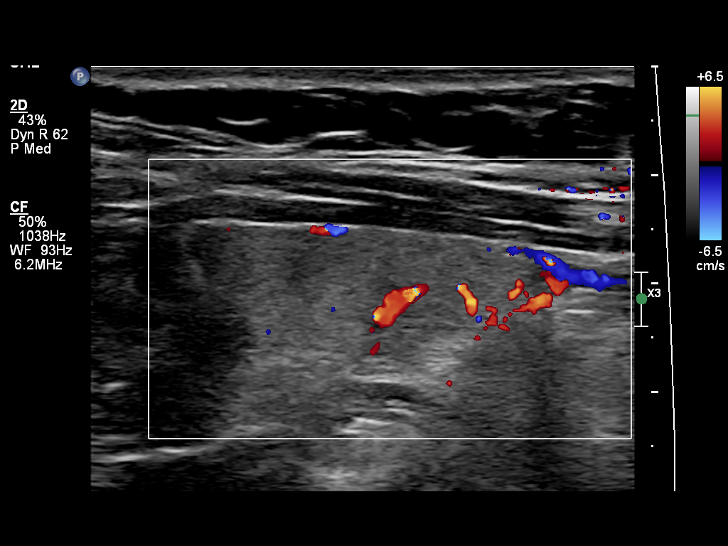
[im 31/67]
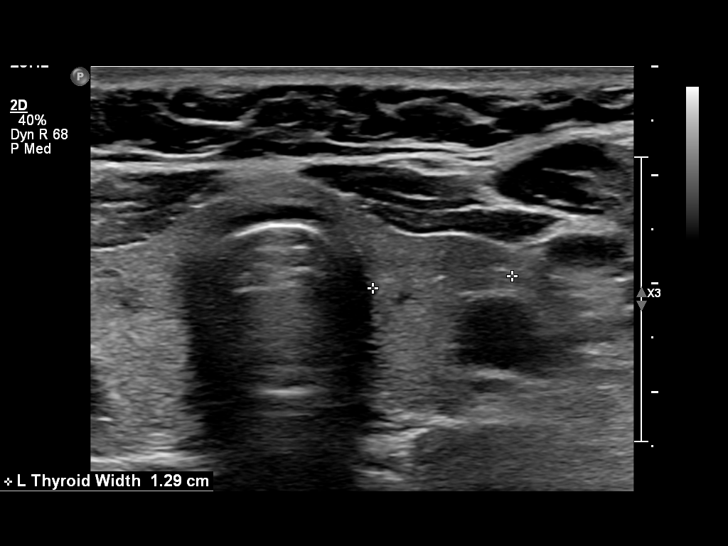
[im 36/67]
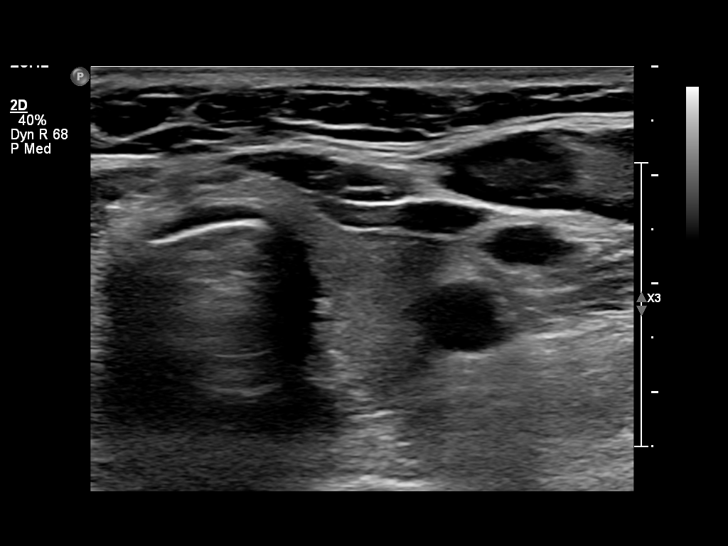
[im 42/67]
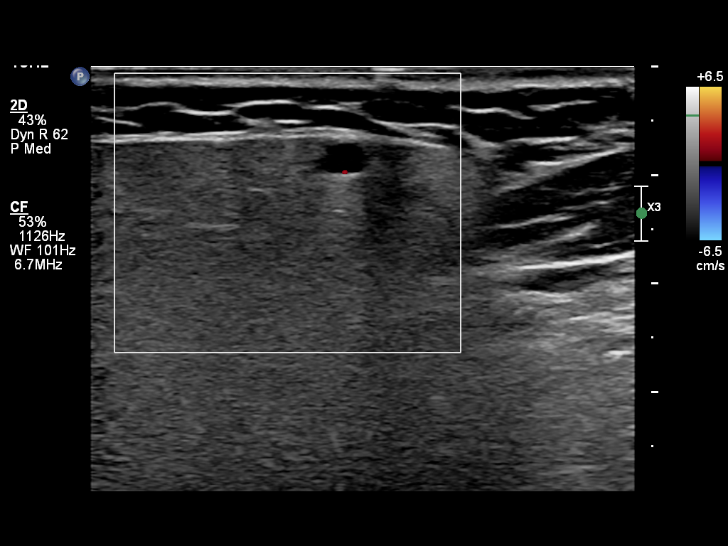
[im 45/67]
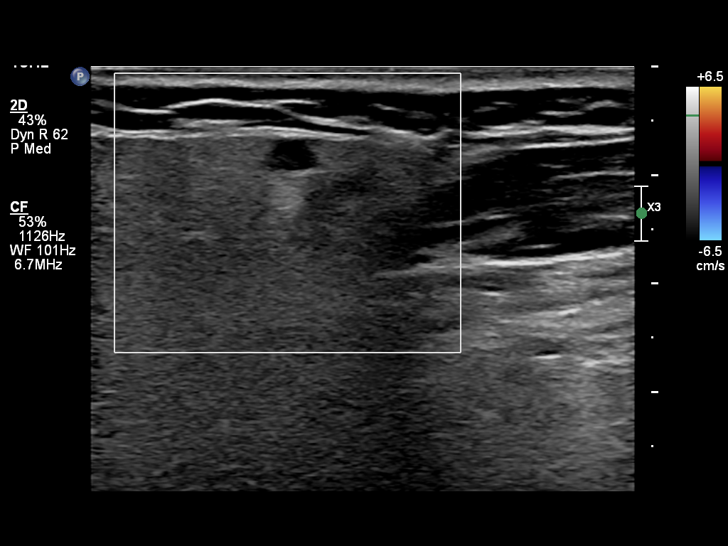
[im 50/67]
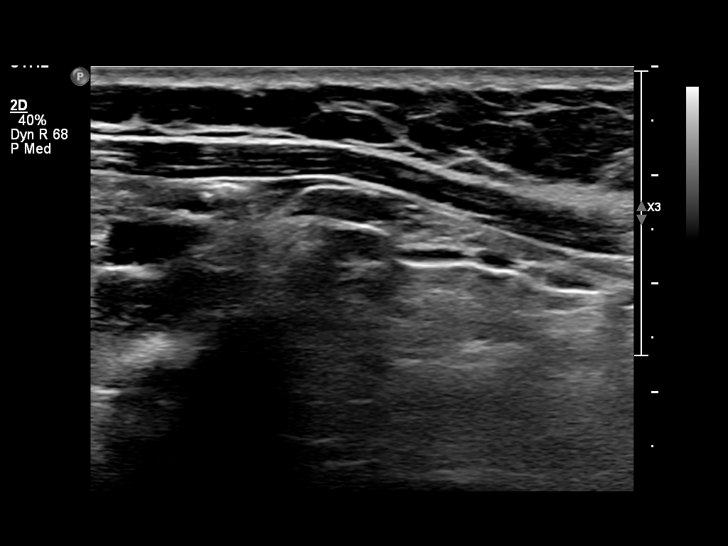
[im 56/67]
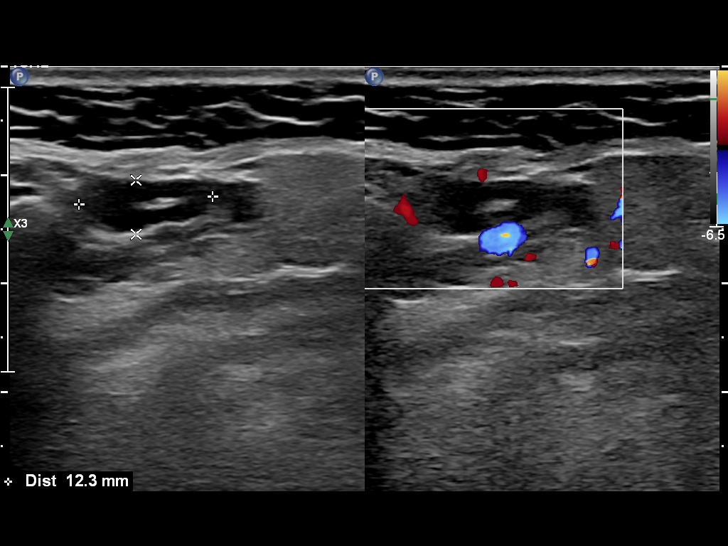
[im 61/67]
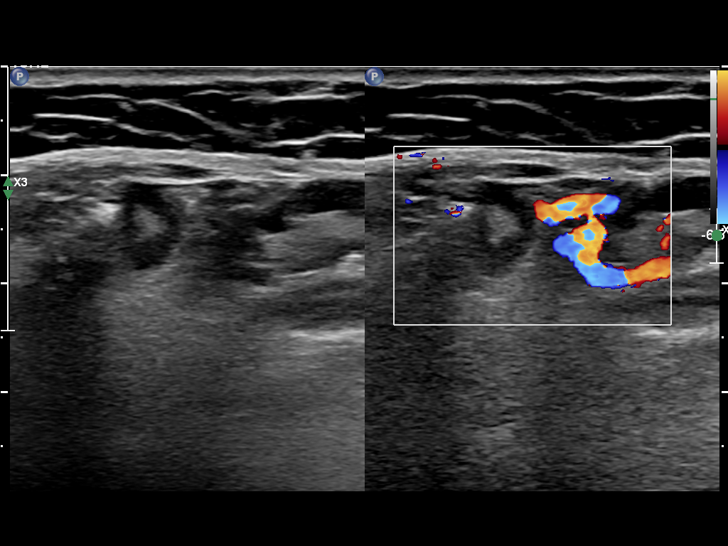
[im 67/67]
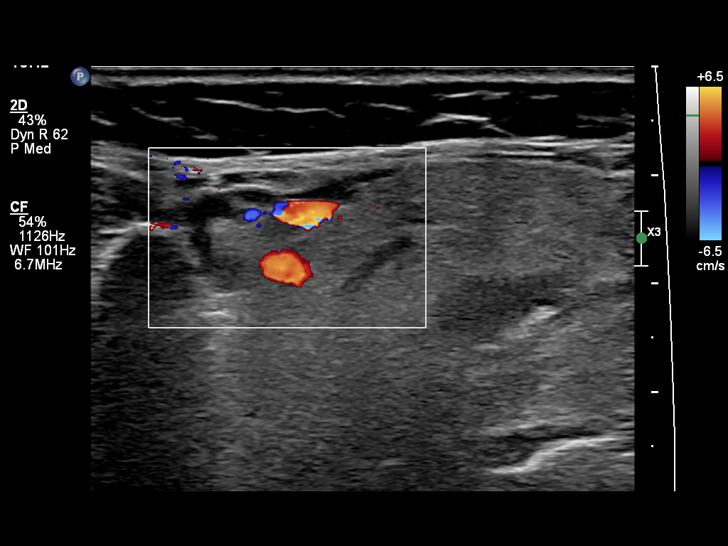

[14 of 25 positions shown; findings below may reference images not displayed]

FINDINGS: Overall size of the thyroid gland is normal at 4.4 x 1.7 x 1.6 cm on the right and 4 x 1.6 x 1.3 cm on the left.  Echotexture of the thyroid gland is normal.  No cystic or solid mass is seen within the thyroid.  No abnormal lymph nodes are seen along the jugular chain.  Benign lymph nodes with fatty hila are noted.  Small cyst is noted within the right parotid salivary gland 4 mm in size and not likely to be of any clinical significance.
IMPRESSION: 1. Normal size and echotexture of thyroid gland without focal lesions.  

2. Other findings as described above.  

Electronically Signed by AVELAR, STIVEN at 15-Ict-5K5K [DATE]

## 2022-07-05 DIAGNOSIS — N63 Unspecified lump in unspecified breast: Secondary | ICD-10-CM | POA: Insufficient documentation

## 2022-07-13 ENCOUNTER — Encounter (HOSPITAL_COMMUNITY): Payer: Self-pay | Admitting: Surgery

## 2022-07-13 ENCOUNTER — Inpatient Hospital Stay
Admission: RE | Admit: 2022-07-13 | Discharge: 2022-07-13 | Disposition: A | Discharge status: Home / Self Care | Source: Ambulatory Visit | Attending: Surgery | Admitting: Surgery

## 2022-07-13 ENCOUNTER — Encounter (HOSPITAL_COMMUNITY)
Admission: RE | Disposition: A | Discharge status: Home / Self Care | Payer: Self-pay | Source: Ambulatory Visit | Attending: Surgery

## 2022-07-13 ENCOUNTER — Ambulatory Visit (HOSPITAL_COMMUNITY): Admitting: Certified Registered"

## 2022-07-13 ENCOUNTER — Encounter (HOSPITAL_COMMUNITY): Admitting: Surgery

## 2022-07-13 ENCOUNTER — Other Ambulatory Visit: Payer: Self-pay

## 2022-07-13 DIAGNOSIS — I10 Essential (primary) hypertension: Secondary | ICD-10-CM | POA: Insufficient documentation

## 2022-07-13 DIAGNOSIS — Z9989 Dependence on other enabling machines and devices: Secondary | ICD-10-CM | POA: Insufficient documentation

## 2022-07-13 DIAGNOSIS — Z6837 Body mass index (BMI) 37.0-37.9, adult: Secondary | ICD-10-CM | POA: Insufficient documentation

## 2022-07-13 DIAGNOSIS — F17210 Nicotine dependence, cigarettes, uncomplicated: Secondary | ICD-10-CM | POA: Insufficient documentation

## 2022-07-13 DIAGNOSIS — Z1211 Encounter for screening for malignant neoplasm of colon: Secondary | ICD-10-CM | POA: Insufficient documentation

## 2022-07-13 DIAGNOSIS — E785 Hyperlipidemia, unspecified: Secondary | ICD-10-CM | POA: Insufficient documentation

## 2022-07-13 DIAGNOSIS — E669 Obesity, unspecified: Secondary | ICD-10-CM | POA: Insufficient documentation

## 2022-07-13 DIAGNOSIS — G4730 Sleep apnea, unspecified: Secondary | ICD-10-CM | POA: Insufficient documentation

## 2022-07-13 SURGERY — COLONOSCOPY
Anesthesia: General | Wound class: Clean Contaminated Wounds-The respiratory, GI, Genital, or urinary

## 2022-07-13 MED ORDER — PROPOFOL 10 MG/ML IV BOLUS
INJECTION | Freq: Once | INTRAVENOUS | Status: DC | PRN
Start: 2022-07-13 — End: 2022-07-13
  Administered 2022-07-13: 50 mg via INTRAVENOUS
  Administered 2022-07-13: 80 mg via INTRAVENOUS
  Administered 2022-07-13: 100 mg via INTRAVENOUS

## 2022-07-13 MED ORDER — LIDOCAINE HCL 10 MG/ML (1 %) INJECTION SOLUTION
INTRAMUSCULAR | Status: AC
Start: 2022-07-13 — End: 2022-07-13
  Filled 2022-07-13: qty 20

## 2022-07-13 MED ORDER — LIDOCAINE (PF) 100 MG/5 ML (2 %) INTRAVENOUS SYRINGE
INJECTION | Freq: Once | INTRAVENOUS | Status: DC | PRN
Start: 2022-07-13 — End: 2022-07-13
  Administered 2022-07-13: 100 mg via INTRAVENOUS

## 2022-07-13 MED ORDER — DEXTROSE 5 % AND LACTATED RINGERS INTRAVENOUS SOLUTION
INTRAVENOUS | Status: DC | PRN
Start: 2022-07-13 — End: 2022-07-13
  Administered 2022-07-13: 0 via INTRAVENOUS

## 2022-07-13 NOTE — OR Surgeon (Signed)
[facility]      Patient Name: Meredith, Brown [facility] Number: [id number]  Date of Service: 07/13/2022   Date of Birth: 05-28-1971      Pre-Operative Diagnosis: SCREENING     Post-Operative Diagnosis: NORMAL COLONOSCOPY SCREENING    Procedure(s)/Description:  COLONOSCOPY: 76195 (CPT)     Attending Surgeon: Fidela Juneau, MD     Anesthesia:  CRNA: Magdalene Patricia, CRNA    Anesthesia Type: .General     Specimens Removed: NONE    The patient indicates that they have read and understood the preoperative colonoscopy consent form. The benefits, risks and alternatives to the procedure were discussed. I specifically discussed the risk of bleeding and/or perforation requiring operation.  The patient was given ample opportunity to ask questions which were addressed to the patient's satisfaction.  The patient indicates they have no further question and wish to proceed. Informed consent was obtained from the patient and/or medical power of attorney.    The patient was brought into the procedure room and placed on the table in the left lateral decubitus position. After IV sedation was given, full finger digital rectal examination was performed with a circumferential sweep of the distal rectal mucosa. Subsequently, the flexible colonoscope was inserted into the rectum and passed without any difficulty. The colonoscope was then advanced up into the sigmoid colon, descending colon, transverse colon, right colon and cecum without any difficulty. Gross examination of each section of the colon was performed showing no specific abnormalities seen. Cecal intubation was achieved and the appendiceal orifice and ileocecal valve were identified. The colonoscope was withdrawn carefully examining the mucosa as the scope was being extracted with particular attention paid to the proximal sides of folds, flexures, bends and rectal valves. At approximately 10 cm. from the anal verge, the colonoscope was retroflexed to  fully examine the distal rectum. The colonoscope was removed and a repeat digital rectal examination was performed at the completion of the procedure. The patient tolerated the procedure well. No intraoperative complications were encountered.    EKG, pulse, pulse oximetry and blood pressure were monitored throughout the entire procedure.  There were no unplanned events.  The patient was instructed to contact me if they have any problems with their colon such as bleeding, pain or changes in bowel habits. They understood and agreed to do so.  The patient will not need another screening colonoscopy for 10 years which is according to ASGE guidelines. However, if in the future the patient has any problems with abdominal pain, changes in bowel habits, blood in stool, etc., then they should contact me because they may be a candidate for diagnostic colonoscopy before the 10 year time limit.    Kao Conry B Areonna Bran, MD,MBA,FACS

## 2022-07-13 NOTE — Discharge Instructions (Addendum)
SURGICAL DISCHARGE INSTRUCTIONS     Dr. Duremdes, Gene B, MD  performed your COLONOSCOPY today at the [facility]  Findings: NORMAL COLONOSCOPY SCREENING     [facility]:  Monday through Friday from 8 a.m. - 4 p.m.: [phone]    For T&D: [phone]  Between 4 p.m. - 8 a.m., weekends and holidays:  Call ER [phone]    PLEASE SEE WRITTEN HANDOUTS AS DISCUSSED BY YOUR NURSE:  Maris Abascal, RN    ANESTHESIA INFORMATION   ANESTHESIA -- ADULT PATIENTS:  You have received intravenous sedation / general anesthesia, and you may feel drowsy and light-headed for several hours. You may even experience some forgetfulness of the procedure. DO NOT DRIVE A MOTOR VEHICLE or perform any activity requiring complete alertness or coordination until you feel fully awake in about 24-48 hours. Do not drink alcoholic beverages for at least 24 hours. Do not stay alone, you must have a responsible adult available to be with you. You may also experience a dry mouth or nausea for 24 hours. This is a normal side effect and will disappear as the effects of the medication wear off.    REMEMBER   If you experience any difficulty breathing, chest pain, bleeding that you feel is excessive, persistent nausea or vomiting or for any other concerns:  Call your physician Dr. Duremdes, Gene B, MD at [phone] . You may also ask to have the general doctor on call paged. They are available to you 24 hours a day.    FOLLOW-UP APPOINTMENTS   Follow-up with Dr. Duremdes if you have any new or worsening symptoms.  Repeat Colonoscopy in 10 years.

## 2022-07-13 NOTE — H&P (Signed)
[facility]  General Surgery  History and Physical    Date of Service:  07/13/2022  Anyston, Foti, 51 y.o. female  Date of Admission:  07/13/2022  Date of Birth:  03-10-1971  PCP: Rebbeca Paul, CNP    Reason for admission:  Colonoscopy    HPI:  Nekaybaw Karter is a 51 y.o. White female who is admitted for SCREENING     Treniyah Talaiya Pham is a 51 y.o. year old female who presents for screening colonoscopy.  No current GI complaints.  No constipation.  No abdominal pain.  No rectal bleeding.  No unexplained weight loss.       Family history of colon cancer:  None     Negative blood thinner  Patient does take Wegovy (glp1 agonist)     Last colonoscopy:  None     Patient's referral to this office included a recent assessment by the referring provider.  This was reviewed by me for this unique office visit for the indication and intent of the referral as well as any pertinent medical or surgical history relevant to the patient's independent evaluation by me today.       Past Medical History:   Diagnosis Date    Hypertension     Mixed hyperlipidemia     Sleep apnea       Past Surgical History:   Procedure Laterality Date    COLONOSCOPY      HX CHOLECYSTECTOMY      HX ENDOMETRIAL BIOPSY      POLYP REMOVAL NOT BIOPSY      Social History     Tobacco Use    Smoking status: Every Day     Packs/day: 0.50     Years: 5.00     Additional pack years: 0.00     Total pack years: 2.50     Types: Cigarettes    Smokeless tobacco: Never   Vaping Use    Vaping Use: Never used   Substance Use Topics    Alcohol use: Not Currently    Drug use: Not Currently       Family Medical History:    None        Medications Prior to Admission       Prescriptions    ALPRAZolam (XANAX) 0.5 mg Oral Tablet    citalopram (CELEXA) 40 mg Oral Tablet    hydroCHLOROthiazide (HYDRODIURIL) 25 mg Oral Tablet    losartan (COZAAR) 100 mg Oral Tablet    naproxen (NAPROSYN) 500 mg Oral Tablet    Phentermine 8 mg Oral Tablet    Take 1 Tablet (8  mg total) by mouth Three times a day    rosuvastatin (CRESTOR) 5 mg Oral Tablet           Allergies   Allergen Reactions    Ciprofloxacin Rash    Erythromycin Rash    Iv Contrast Rash    Sulfa (Sulfonamides) Rash          Patient Vitals for the past 24 hrs:   BP Temp Pulse Resp SpO2 Height Weight   07/13/22 0702 125/63 36.6 C (97.9 F) 83 19 95 % 1.626 m (5\' 4" ) 98 kg (216 lb)          General: appropriate for age. in no acute distress.    Vital signs are present above and have been reviewed by me     HEENT: Atraumatic, Normocephalic. PERRLA, EOMI. Nose clear. Throat clear.    Lungs:  Nonlabored breathing with symmetric expansion.  Clear to auscultation bilaterally    Heart:Regular wth respect to rate and rythmn.    Abdomen:Soft. Nontender. Nondistended and benign    Extremities:  Grossly normal with good range of motion and no major deformities.    Neuro:  Grossly normal motor and sensory function. CN's II through XII intact.    Psychiatric: Alert and oriented to person, place, and time. affect appropriate    Laboratory Data:     No results found for any visits on 07/13/22 (from the past 24 hour(s)).    Imaging Studies:    No orders to display        Assessment/Plan:  SCREENING    Colonoscopy scheduled for Friday July 13, 2022    This note was partially created using voice recognition software and is inherently subject to errors including those of syntax and "sound alike " substitutions which may escape proof reading. In such instances, original meaning may be extrapolated by contextual derivation.    Fidela Juneau, MD, MBA, FACS

## 2022-07-13 NOTE — Anesthesia Preprocedure Evaluation (Signed)
ANESTHESIA PRE-OP EVALUATION  Planned Procedure: COLONOSCOPY  Review of Systems     anesthesia history negative     patient summary reviewed  nursing notes reviewed        Pulmonary   sleep apnea, CPAP and current smoker,   Cardiovascular    Hypertension, ECG reviewed and hyperlipidemia , Exercise Tolerance: > or = 4 METS        GI/Hepatic/Renal   negative GI/hepatic/renal ROS,         Endo/Other    obesity,      Neuro/Psych/MS   negative neuro/psych ROS,      Cancer    negative hematology/oncology ROS,               Physical Assessment      Airway       Mallampati: II    TM distance: >3 FB    Mouth Opening: good.            Dental       Dentition intact             Pulmonary    Breath sounds clear to auscultation       Cardiovascular    Rhythm: regular  Rate: Normal       Other findings          Plan  ASA 2     Planned anesthesia type: general     general intravenous        SLEEP APNEA  Patient is at risk of obstructive sleep apnea and Education provided regarding risk of obstructive sleep apnea          Intravenous induction       Anesthetic plan and risks discussed with patient  signed consent obtained            Patient's NPO status is appropriate for Anesthesia.

## 2022-07-13 NOTE — Anesthesia Postprocedure Evaluation (Signed)
Anesthesia Post Op Evaluation    Patient: Meredith Brown  Procedure(s):  COLONOSCOPY    Last Vitals:Temperature: 36.6 C (97.9 F) (07/13/22 0702)  Heart Rate: 83 (07/13/22 0702)  BP (Non-Invasive): 125/63 (07/13/22 2707)  Respiratory Rate: 19 (07/13/22 0702)  SpO2: 95 % (07/13/22 0702)    No notable events documented.    Patient is sufficiently recovered from the effects of anesthesia to participate in the evaluation and has returned to their pre-procedure level.  Patient location during evaluation: PACU       Patient participation: complete - patient participated  Level of consciousness: awake and alert and responsive to verbal stimuli    Pain management: adequate  Airway patency: patent    Anesthetic complications: no  Cardiovascular status: acceptable  Respiratory status: acceptable  Hydration status: acceptable  Patient post-procedure temperature: Pt Normothermic   PONV Status: Absent

## 2023-02-15 ENCOUNTER — Ambulatory Visit (INDEPENDENT_AMBULATORY_CARE_PROVIDER_SITE_OTHER): Admitting: OTOLARYNGOLOGY

## 2023-02-15 ENCOUNTER — Other Ambulatory Visit: Payer: Self-pay

## 2023-02-15 ENCOUNTER — Encounter (INDEPENDENT_AMBULATORY_CARE_PROVIDER_SITE_OTHER): Payer: Self-pay | Admitting: OTOLARYNGOLOGY

## 2023-02-15 VITALS — Ht 64.0 in | Wt 216.0 lb

## 2023-02-15 DIAGNOSIS — R490 Dysphonia: Secondary | ICD-10-CM

## 2023-02-15 DIAGNOSIS — K219 Gastro-esophageal reflux disease without esophagitis: Secondary | ICD-10-CM

## 2023-02-15 DIAGNOSIS — J387 Other diseases of larynx: Secondary | ICD-10-CM

## 2023-02-15 DIAGNOSIS — J383 Other diseases of vocal cords: Secondary | ICD-10-CM

## 2023-02-15 DIAGNOSIS — H9209 Otalgia, unspecified ear: Secondary | ICD-10-CM

## 2023-02-15 MED ORDER — CEFDINIR 300 MG CAPSULE
300.0000 mg | ORAL_CAPSULE | Freq: Two times a day (BID) | ORAL | 0 refills | Status: DC
Start: 2023-02-15 — End: 2023-03-22

## 2023-02-18 ENCOUNTER — Encounter (INDEPENDENT_AMBULATORY_CARE_PROVIDER_SITE_OTHER): Payer: Self-pay | Admitting: OTOLARYNGOLOGY

## 2023-02-18 NOTE — H&P (Signed)
[facility]  [address]  [place] [address]    History and Physical    Name: Meredith Brown MRN:  [id number]   Date: 02/15/2023 DOB:  1971-08-11 (52 y.o.)            New Patient      Chief Complaint:    Chief Complaint   Patient presents with    Throat Symptoms     Patient complains of hoarseness x 3 weeks. States this started with etd L ear. Treated with steroid shot, medrol dose pack, Cefdinir and Rocephin shot       HPI:  Meredith Brown is a 52 y.o. female presenting for a new patient visit. Patient states that she was sick several weeks ago and was coughing and throat clearing. She has had hoarseness x 3 weeks. She was given medrol dose pack and had rocephin injection with minimal improvement. She does take omeprazole for reflux. She denies any dyphagia, weight loss, or neck masses.    Past Medical History:   Diagnosis Date    Diabetes mellitus (CMS HCC)     Hypertension     Mixed hyperlipidemia     Sleep apnea          Past Surgical History:   Procedure Laterality Date    COLONOSCOPY      HX CHOLECYSTECTOMY      HX ENDOMETRIAL BIOPSY      POLYP REMOVAL NOT BIOPSY         Family Medical History:    None         Social History     Tobacco Use    Smoking status: Every Day     Current packs/day: 0.50     Average packs/day: 0.5 packs/day for 5.0 years (2.5 ttl pk-yrs)     Types: Cigarettes    Smokeless tobacco: Never   Vaping Use    Vaping status: Never Used   Substance Use Topics    Alcohol use: Not Currently    Drug use: Not Currently        Medications:  Current Outpatient Medications   Medication Sig    ALPRAZolam (XANAX) 0.5 mg Oral Tablet     cefdinir (OMNICEF) 300 mg Oral Capsule Take 1 Capsule (300 mg total) by mouth Twice daily for 10 days    citalopram (CELEXA) 40 mg Oral Tablet     hydroCHLOROthiazide (HYDRODIURIL) 25 mg Oral Tablet     losartan (COZAAR) 100 mg Oral Tablet     MOUNJARO 7.5 mg/0.5 mL Subcutaneous Pen Injector     naproxen (NAPROSYN) 500 mg Oral Tablet     omeprazole  (PRILOSEC) 40 mg Oral Capsule, Delayed Release(E.C.)        Allergies:  Allergies   Allergen Reactions    Ciprofloxacin Rash    Erythromycin Rash    Iv Contrast Rash    Sulfa (Sulfonamides) Rash       Review of Systems:  Review of Systems     Physical Exam:  Vitals:    02/15/23 0811   Weight: 98 kg (216 lb)   Height: 1.626 m (5\' 4" )   BMI: 37.15      ENT Physical Exam  Constitutional  Appearance: patient appears well-developed, well-nourished and well-groomed,  Communication/Voice: communication appropriate for developmental age; vocal quality normal;  Head and Face  Appearance: head appears normal, face appears normal and face appears atraumatic;  Palpation: facial palpation normal;  Salivary: glands normal;  Ear  Hearing:  intact;  Auricles: right auricle normal; left auricle normal;  External Mastoids: right external mastoid normal; left external mastoid normal;  Ear Canals: right ear canal normal; left ear canal normal;  Tympanic Membranes: right tympanic membrane normal; left tympanic membrane normal;  Nose  External Nose: nares patent bilaterally; external nose normal;  Internal Nose: nasal mucosa normal; septum normal; bilateral inferior turbinates normal;  Oral Cavity/Oropharynx  Lips: normal;  Teeth: normal;  Gums: gingiva normal;  Tongue: normal;  Oral mucosa: normal;  Hard palate: normal;  Neck  Neck: neck normal; neck palpation normal;  Thyroid: thyroid normal;  Respiratory  Inspection: breathing unlabored; normal breathing rate;  Lymphatic  Palpation: lymph nodes normal;  Neurovestibular  Mental Status: alert and oriented;  Psychiatric: mood normal; affect is appropriate;  Cranial Nerves: cranial nerves intact;       Assessment and Plan:    ICD-10-CM    1. Hoarseness  R49.0 31575 - LARYNGOSCOPY, FLEXIBLE DIAGNOSTIC (AMB ONLY)      2. 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 Otalgia, unspecified laterality  H92.09 POCT HEARING/VISION/TYMPANOGRAM (AMB ONLY)         Orders Placed This Encounter    47829 - LARYNGOSCOPY, FLEXIBLE DIAGNOSTIC (AMB ONLY)    POCT HEARING/VISION/TYMPANOGRAM (AMB ONLY)    cefdinir (OMNICEF) 300 mg Oral Capsule   Patient has bilateral TVF nodules with ulceration.  Discussed voice rest and hygiene. Will give cefdinir.  Continue omeprazole.     Follow Up:  Return in about 4 weeks (around 03/15/2023).     Conchita Paris, DO

## 2023-02-18 NOTE — Procedures (Signed)
[facility]  [address]  [place] [address]    Procedure Note    Name: Wyatt Galvan MRN:  [id number]   Date: 02/15/2023 DOB:  [birth year] (52 y.o.)         31575 - LARYNGOSCOPY, FLEXIBLE DIAGNOSTIC (AMB ONLY)    Performed by: Conchita Paris, DO  Authorized by: Conchita Paris, DO    Time Out:     Immediately before the procedure, a time out was called:  Yes    Patient verified:  Yes    Procedure Verified:  Yes    Site Verified:  Yes  Documentation:      [facility]  [address] [place] [address]    Procedure Note    Name: Ariany Kesselman MRN:  [id number]  Date: 02/15/2023 DOB:  1971-06-11 (52 y.o.)        @PROCDOC @    Indications for procedure: Hoarseness    Anesthesia: Oxymetazoline nasal spray    Description: The flexible endoscope was gently introduced into the nostril and passed along the floor of the nose to the nasopharynx. Adenoid was minimal and eustachian tubes normal. The retropalatal airway was patent.    The endoscope was passed to the oropharynx. Base of tongue displayed normal lingual tonsils, patent valelulla, and sharply defined upright epiglottis. Retrolingual airway was patent.    The larynx displayed true vocal cords with good mobility. False cords were normal. Arytenoid mucosa was pink with no edema.     The piriform recesses were symmetric without secretion. The hypopharynx was symmetric without lesion.    Findings: bilateral anterior TVF nodules with ulceration    The patient tolerated the procedure well.    Conchita Paris, DO                 Arcangel Minion [place], DO

## 2023-03-21 ENCOUNTER — Ambulatory Visit (INDEPENDENT_AMBULATORY_CARE_PROVIDER_SITE_OTHER): Payer: Self-pay | Admitting: OTOLARYNGOLOGY

## 2023-03-22 ENCOUNTER — Other Ambulatory Visit: Payer: Self-pay

## 2023-03-22 ENCOUNTER — Encounter (INDEPENDENT_AMBULATORY_CARE_PROVIDER_SITE_OTHER): Payer: Self-pay | Admitting: OTOLARYNGOLOGY

## 2023-03-22 ENCOUNTER — Ambulatory Visit (INDEPENDENT_AMBULATORY_CARE_PROVIDER_SITE_OTHER): Admitting: OTOLARYNGOLOGY

## 2023-03-22 VITALS — Ht 64.0 in | Wt 213.0 lb

## 2023-03-22 DIAGNOSIS — H9209 Otalgia, unspecified ear: Secondary | ICD-10-CM

## 2023-03-22 DIAGNOSIS — K219 Gastro-esophageal reflux disease without esophagitis: Secondary | ICD-10-CM

## 2023-03-22 DIAGNOSIS — J387 Other diseases of larynx: Secondary | ICD-10-CM

## 2023-03-22 DIAGNOSIS — R490 Dysphonia: Secondary | ICD-10-CM

## 2023-03-22 MED ORDER — AZELASTINE 137 MCG (0.1 %) NASAL SPRAY
2.0000 | Freq: Every evening | NASAL | 6 refills | Status: AC
Start: 2023-03-22 — End: ?

## 2023-04-01 ENCOUNTER — Encounter (INDEPENDENT_AMBULATORY_CARE_PROVIDER_SITE_OTHER): Payer: Self-pay | Admitting: OTOLARYNGOLOGY

## 2023-04-01 NOTE — H&P (Signed)
[facility]  [address]  [place] [address]    Progress Note    Name: Meredith Brown MRN:  [id number]   Date: 03/22/2023 DOB:  11/21/1970 (52 y.o.)              Follow Up      Subjective:   Chief Complaint:   Throat Symptoms (1 mo rc on hoarseness, no complaints. Pt doing better)       History of Present Illness:  Meredith Brown is a 52 y.o. old female who presents to the clinic for follow-up. Patient states her voice is doing much better. She does states she is having increased PND and congestions.      Review of Systems     Physical Exam:     Vitals:    03/22/23 0846   Weight: 96.6 kg (213 lb)   Height: 1.626 m (5\' 4" )   BMI: 36.64      ENT Physical Exam  Constitutional  Appearance: patient appears well-developed, well-nourished and well-groomed,  Communication/Voice: communication appropriate for developmental age; vocal quality normal;  Head and Face  Appearance: head appears normal, face appears normal and face appears atraumatic;  Palpation: facial palpation normal;  Salivary: glands normal;  Ear  Hearing: intact;  Auricles: right auricle normal; left auricle normal;  External Mastoids: right external mastoid normal; left external mastoid normal;  Ear Canals: right ear canal normal; left ear canal normal;  Tympanic Membranes: right tympanic membrane normal; left tympanic membrane normal;  Nose  External Nose: nares patent bilaterally; external nose normal;  Internal Nose: nasal mucosa normal; septum normal; bilateral inferior turbinates normal;  Oral Cavity/Oropharynx  Lips: normal;  Teeth: normal;  Gums: gingiva normal;  Tongue: normal;  Oral mucosa: normal;  Hard palate: normal;  Neck  Neck: neck normal; neck palpation normal;  Thyroid: thyroid normal;  Respiratory  Inspection: breathing unlabored; normal breathing rate;  Lymphatic  Palpation: lymph nodes normal;  Neurovestibular  Mental Status: alert and oriented;  Psychiatric: mood normal; affect is appropriate;  Cranial Nerves:  cranial nerves intact;       Assessment and Plan:       ICD-10-CM    1. Hoarseness  R49.0 31575 - LARYNGOSCOPY, FLEXIBLE DIAGNOSTIC (AMB ONLY)      2. 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 Otalgia, unspecified laterality  H92.09         Orders Placed This Encounter    31575 - LARYNGOSCOPY, FLEXIBLE DIAGNOSTIC (AMB ONLY)    azelastine (ASTELIN) 137 mcg (0.1 %) Nasal Spray, Non-Aerosol   Will start astelin  Voice has improved  Continue omeprazole.      Follow up:  Return if symptoms worsen or fail to improve.    Conchita Paris, DO

## 2023-04-01 NOTE — Procedures (Signed)
[facility]  [address]  [place] [address]    Procedure Note    Name: Meredith Brown MRN:  [id number]   Date: 03/22/2023 DOB:  February 22, 1971 (52 y.o.)         31575 - LARYNGOSCOPY, FLEXIBLE DIAGNOSTIC (AMB ONLY)    Performed by: Conchita Paris, DO  Authorized by: Conchita Paris, DO    Time Out:     Immediately before the procedure, a time out was called:  Yes    Patient verified:  Yes    Procedure Verified:  Yes    Site Verified:  Yes  Documentation:      [facility]  [address] [place] [address]    Procedure Note    Name: Meredith Brown MRN:  [id number]  Date: 03/22/2023 DOB:  Oct 19, 1970 (52 y.o.)        @PROCDOC @    Indications for procedure: Hoarseness    Anesthesia: Oxymetazoline nasal spray    Description: The flexible endoscope was gently introduced into the nostril and passed along the floor of the nose to the nasopharynx. Adenoid was minimal and eustachian tubes normal. The retropalatal airway was patent.    The endoscope was passed to the oropharynx. Base of tongue displayed normal lingual tonsils, patent valelulla, and sharply defined upright epiglottis. Retrolingual airway was patent.    The larynx displayed normal true vocal cords with good mobility. False cords were normal. Arytenoid mucosa was pink with no edema.     The piriform recesses were symmetric without secretion. The hypopharynx was symmetric without lesion.    Findings: Symmetrical true vocal fold motion, laryngeal nodule resolved.    The patient tolerated the procedure well.    Conchita Paris, DO                 Charmayne Odell [place], DO
